# Patient Record
Sex: Female | Born: 2000 | Race: Black or African American | Hispanic: No | Marital: Single | Smoking: Never smoker
Health system: Southern US, Community
[De-identification: ages and names within clinical notes are randomized; demographics above are authoritative.]

## PROBLEM LIST (undated history)

## (undated) DIAGNOSIS — N760 Acute vaginitis: Secondary | ICD-10-CM

## (undated) DIAGNOSIS — B9689 Other specified bacterial agents as the cause of diseases classified elsewhere: Secondary | ICD-10-CM

## (undated) DIAGNOSIS — Z8619 Personal history of other infectious and parasitic diseases: Secondary | ICD-10-CM

## (undated) HISTORY — DX: Personal history of other infectious and parasitic diseases: Z86.19

## (undated) HISTORY — DX: Acute vaginitis: B96.89

## (undated) HISTORY — PX: TONSILLECTOMY: SUR1361

## (undated) HISTORY — DX: Other specified bacterial agents as the cause of diseases classified elsewhere: B96.89

## (undated) HISTORY — DX: Other specified bacterial agents as the cause of diseases classified elsewhere: N76.0

---
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
## 2019-02-08 ENCOUNTER — Telehealth: Payer: Self-pay | Admitting: Pediatrics

## 2019-02-08 NOTE — Telephone Encounter (Signed)
Patient is calling and requesting to speak with RN Keri. She was offered a virtual appointment with Alyse Low and the patient declined. Patient was then informed that Kristin Bruins will call them back at their primary phone number at [phone]

## 2019-02-08 NOTE — Telephone Encounter (Signed)
Spoke with patient. She had exposure to STI with partner. No current symptoms. Partner tested positive today. Made soonest avail onsite visit for treatment and additional screenings. Pt to abstain from sex from now until 7 days after treatment. Pt also voiced concerns about mother being made aware of positive STI results from her OBGYN (name of office unknown). Explained that [facility] does not disclose confidential information unless permission has been given by patient and we take such matters very seriously. [facility] will continue to disclose information only to patient as this is our protocol. Suggested patient call her OBGYN office and update all information to her contact information and make office aware of breach of confidentiality.

## 2019-02-08 NOTE — Telephone Encounter (Signed)
Called number on file X2, went straight to VM, voicemail box full and cannot accept messages.

## 2019-02-09 ENCOUNTER — Telehealth: Payer: Self-pay | Admitting: Pediatrics

## 2019-02-09 NOTE — Telephone Encounter (Signed)

## 2019-02-10 ENCOUNTER — Ambulatory Visit: Payer: 59 | Admitting: Family

## 2019-02-16 ENCOUNTER — Telehealth: Payer: Self-pay | Admitting: Pediatrics

## 2019-02-16 NOTE — Telephone Encounter (Signed)

## 2019-02-17 ENCOUNTER — Ambulatory Visit (INDEPENDENT_AMBULATORY_CARE_PROVIDER_SITE_OTHER): Payer: 59 | Admitting: Family

## 2019-02-17 ENCOUNTER — Encounter: Payer: Self-pay | Admitting: Family

## 2019-02-17 ENCOUNTER — Other Ambulatory Visit (HOSPITAL_COMMUNITY)
Admission: RE | Admit: 2019-02-17 | Discharge: 2019-02-17 | Disposition: A | Discharge status: Home / Self Care | Payer: 59 | Source: Ambulatory Visit | Attending: Family | Admitting: Family

## 2019-02-17 ENCOUNTER — Other Ambulatory Visit: Payer: Self-pay

## 2019-02-17 DIAGNOSIS — Z202 Contact with and (suspected) exposure to infections with a predominantly sexual mode of transmission: Secondary | ICD-10-CM | POA: Insufficient documentation

## 2019-02-17 MED ORDER — ONDANSETRON 4 MG PO TBDP: 8.0000 mg | ORAL_TABLET | Freq: Once | ORAL | Status: DC

## 2019-02-17 MED ORDER — METRONIDAZOLE 250 MG PO TABS: 2000.0000 mg | ORAL_TABLET | Freq: Once | ORAL | Status: DC

## 2019-02-17 MED ORDER — ONDANSETRON 8 MG PO TBDP: 8.0000 mg | ORAL_TABLET | Freq: Three times a day (TID) | ORAL | 0 refills | Status: DC | PRN

## 2019-02-17 MED ORDER — METRONIDAZOLE 500 MG PO TABS: 500.0000 mg | ORAL_TABLET | Freq: Two times a day (BID) | ORAL | 0 refills | Status: DC

## 2019-02-17 NOTE — Progress Notes (Signed)
Pt here today for presumptive tx of Trichomoniasis. Partner tested positive last week- tested negative for Gonorrhea and Chlamydia. Pt denies pelvic pain and pain with intercourse. RX sent to pharmacy and she will take today after eating well.

## 2019-02-18 ENCOUNTER — Encounter: Payer: Self-pay | Admitting: Family

## 2019-02-21 LAB — URINE CYTOLOGY ANCILLARY ONLY
Bacterial Vaginitis-Urine: POSITIVE — AB
Bacterial Vaginitis-Urine: POSITIVE — AB
Bacterial Vaginitis-Urine: POSITIVE — AB
Bacterial Vaginitis-Urine: POSITIVE — AB
Candida Urine: NEGATIVE
Chlamydia: NEGATIVE
Comment: NEGATIVE
Comment: NEGATIVE
Comment: NORMAL
Neisseria Gonorrhea: NEGATIVE
Trichomonas: NEGATIVE

## 2019-07-21 ENCOUNTER — Other Ambulatory Visit (HOSPITAL_COMMUNITY)
Admission: RE | Admit: 2019-07-21 | Discharge: 2019-07-21 | Disposition: A | Discharge status: Home / Self Care | Payer: 59 | Source: Ambulatory Visit | Attending: Family | Admitting: Family

## 2019-07-21 ENCOUNTER — Encounter: Payer: Self-pay | Admitting: Family

## 2019-07-21 ENCOUNTER — Other Ambulatory Visit: Payer: Self-pay

## 2019-07-21 ENCOUNTER — Ambulatory Visit (INDEPENDENT_AMBULATORY_CARE_PROVIDER_SITE_OTHER): Payer: 59 | Admitting: Family

## 2019-07-21 VITALS — BP 112/69 | HR 105 | Ht 64.33 in | Wt 105.8 lb

## 2019-07-21 DIAGNOSIS — Z30011 Encounter for initial prescription of contraceptive pills: Secondary | ICD-10-CM

## 2019-07-21 DIAGNOSIS — N898 Other specified noninflammatory disorders of vagina: Secondary | ICD-10-CM

## 2019-07-21 DIAGNOSIS — Z113 Encounter for screening for infections with a predominantly sexual mode of transmission: Secondary | ICD-10-CM | POA: Diagnosis not present

## 2019-07-21 MED ORDER — NORETHINDRONE ACET-ETHINYL EST 1.5-30 MG-MCG PO TABS: 1.0000 | ORAL_TABLET | Freq: Every day | ORAL | 3 refills | Status: DC

## 2019-07-21 NOTE — Progress Notes (Signed)
History was provided by the patient.   Beth Warren is a 19 y.o. female who is here for STI screening and change in birth control.   PCP confirmed? Yes.    Ander Slade, NP  HPI:   -has been on patch and wants to switch  -states patches move around  -when she was on Depo prior she had breakthrough bleeding  -does not want an implant or IUD  -thinks she would like to try the pill  -states that when she was treated for presumptive Trich in November, which was actually BV -she reports being unable swallow pills so she would like STI testing; having green vaginal discharge  -she smokes black&milds occasionally, asks if cigars have nicotine; is willing to stop smoking in order to be on OCPs.      Review of Systems  Constitutional: Negative for chills, fever and malaise/fatigue.  HENT: Negative for sore throat.   Eyes: Negative for blurred vision and pain.  Respiratory: Negative for cough and shortness of breath.   Cardiovascular: Negative for chest pain and palpitations.  Gastrointestinal: Negative for abdominal pain and nausea.  Genitourinary: Negative for dysuria and hematuria.       No dyspareunia, no cramping    Musculoskeletal: Negative for joint pain and myalgias.  Skin: Negative for rash.  Neurological: Negative for dizziness and headaches.  Endo/Heme/Allergies: Does not bruise/bleed easily.  Psychiatric/Behavioral: Negative for depression and suicidal ideas.    Patient Active Problem List   Diagnosis Date Noted  . Dyspepsia 05/27/2018  . Teenage mother 07/16/2015    Current Outpatient Medications on File Prior to Visit  Medication Sig Dispense Refill  . adapalene (DIFFERIN) 0.1 % gel Apply topically at bedtime. (Patient not taking: Reported on 10/11/2018) 45 g 0  . clindamycin-benzoyl peroxide (BENZACLIN) gel Apply topically daily. (Patient not taking: Reported on 10/11/2018) 50 g 4  . metroNIDAZOLE (FLAGYL) 500 MG tablet Take 1 tablet (500 mg total) by mouth 2  (two) times daily. (Patient not taking: Reported on 07/21/2019) 14 tablet 0  . norelgestromin-ethinyl estradiol (ORTHO EVRA) 150-35 MCG/24HR transdermal patch Place 1 patch onto the skin once a week. (Patient not taking: Reported on 07/21/2019) 3 patch 12  . ondansetron (ZOFRAN ODT) 8 MG disintegrating tablet Take 1 tablet (8 mg total) by mouth every 8 (eight) hours as needed for nausea or vomiting. (Patient not taking: Reported on 07/21/2019) 20 tablet 0  . ranitidine (ZANTAC) 150 MG tablet Take 1 tablet (150 mg total) by mouth 2 (two) times daily. (Patient not taking: Reported on 09/04/2018) 60 tablet 1  . triamcinolone ointment (KENALOG) 0.1 % Apply 1 application topically 2 (two) times daily. (Patient not taking: Reported on 07/21/2019) 30 g 3   No current facility-administered medications on file prior to visit.    Allergies  Allergen Reactions  . Peach [Prunus Persica] Itching    6/20.     Physical Exam:    Vitals:   07/21/19 1021  BP: 112/69  Pulse: (!) 105  Weight: 105 lb 12.8 oz (48 kg)  Height: 5' 4.33" (1.634 m)    Growth percentile SmartLinks can only be used for patients less than 38 years old. No LMP recorded.  Physical Exam Vitals reviewed.  Constitutional:      Appearance: Normal appearance.  HENT:     Head: Normocephalic.     Nose: Nose normal.     Mouth/Throat:     Mouth: Mucous membranes are dry.  Eyes:     Extraocular Movements:  Extraocular movements intact.     Pupils: Pupils are equal, round, and reactive to light.  Cardiovascular:     Rate and Rhythm: Normal rate.     Pulses: Normal pulses.     Heart sounds: No murmur.  Pulmonary:     Effort: Pulmonary effort is normal.     Breath sounds: Normal breath sounds.  Abdominal:     General: Abdomen is flat.  Musculoskeletal:        General: Normal range of motion.     Cervical back: Normal range of motion.  Lymphadenopathy:     Cervical: No cervical adenopathy.  Skin:    General: Skin is warm and dry.      Capillary Refill: Capillary refill takes less than 2 seconds.     Findings: No rash.  Neurological:     General: No focal deficit present.     Mental Status: She is alert and oriented to person, place, and time.  Psychiatric:        Mood and Affect: Mood normal.    Assessment/Plan:  19 yo A/I female currently on contraceptive patch; she desires to change to OCPs. She also is concerned about vaginal discharge which she describes as green. She has no concerning symptoms which would warrant a pelvic exam at this time (no unexplained vaginal bleeding and no pain with intercourse). Today we will do a wet prep and urine screening to test for infections. Discussed contraceptive options and she elects COCs without any contraindications for estrogen. We reviewed increased stroke risk and clotting risks with nicotine use.    1. Vaginal discharge 2. Encounter for BCP (birth control pills) initial prescription 3. Routine screening for STI (sexually transmitted infection)  - WET PREP BY MOLECULAR PROBE - Urine cytology ancillary only

## 2019-07-22 ENCOUNTER — Telehealth: Payer: Self-pay

## 2019-07-22 LAB — URINE CYTOLOGY ANCILLARY ONLY
Chlamydia: NEGATIVE
Comment: NEGATIVE
Comment: NORMAL
Neisseria Gonorrhea: POSITIVE — AB

## 2019-07-22 LAB — WET PREP BY MOLECULAR PROBE
Candida species: NOT DETECTED
MICRO NUMBER:: 10341964
SPECIMEN QUALITY:: ADEQUATE
Trichomonas vaginosis: NOT DETECTED

## 2019-07-22 NOTE — Telephone Encounter (Signed)
Please call pt back with results. She does not understands the results that were sent on mychart.

## 2019-07-23 ENCOUNTER — Encounter: Payer: Self-pay | Admitting: Family

## 2019-07-25 NOTE — Telephone Encounter (Signed)
Called number on file, no answer, VM box full.   

## 2019-07-26 ENCOUNTER — Telehealth: Payer: Self-pay | Admitting: Pediatrics

## 2019-07-26 ENCOUNTER — Other Ambulatory Visit: Payer: Self-pay | Admitting: Pediatrics

## 2019-07-26 DIAGNOSIS — Z113 Encounter for screening for infections with a predominantly sexual mode of transmission: Secondary | ICD-10-CM

## 2019-07-26 DIAGNOSIS — A549 Gonococcal infection, unspecified: Secondary | ICD-10-CM

## 2019-07-26 MED ORDER — CEFTRIAXONE SODIUM 500 MG IJ SOLR
500.0000 mg | Freq: Once | INTRAMUSCULAR | Status: AC
  Administered 2019-08-18: 16:00:00 500 mg via INTRAMUSCULAR

## 2019-07-26 NOTE — Telephone Encounter (Signed)
Patient called and stated that she wa told to see when she can come in for appt. I was going to make and appt and she she said nevermind she will be here tomorrow at 330. Please call back.

## 2019-07-26 NOTE — Telephone Encounter (Signed)
No one from the clinic has spoken with patient. RN has called multiple times with no success. If patient walks into clinic, will ask available CMA to give Rocephin. Tried calling patient again to make appointment with RN. No answer and no VM option avail.

## 2019-07-26 NOTE — Telephone Encounter (Signed)
Order for rocephin placed. Also placed lab order for HIV and syphilis.

## 2019-07-27 ENCOUNTER — Ambulatory Visit (INDEPENDENT_AMBULATORY_CARE_PROVIDER_SITE_OTHER): Payer: 59

## 2019-07-27 ENCOUNTER — Other Ambulatory Visit: Payer: Self-pay | Admitting: Pediatrics

## 2019-07-27 DIAGNOSIS — A599 Trichomoniasis, unspecified: Secondary | ICD-10-CM

## 2019-07-27 DIAGNOSIS — Z202 Contact with and (suspected) exposure to infections with a predominantly sexual mode of transmission: Secondary | ICD-10-CM

## 2019-07-27 MED ORDER — METRONIDAZOLE 500 MG PO TABS: 500.0000 mg | ORAL_TABLET | Freq: Two times a day (BID) | ORAL | 0 refills | Status: AC

## 2019-07-27 MED ORDER — ONDANSETRON HCL 8 MG PO TABS: 8.0000 mg | ORAL_TABLET | Freq: Three times a day (TID) | ORAL | 0 refills | Status: DC | PRN

## 2019-07-27 NOTE — Progress Notes (Signed)
Pt states she has been sexually active with a previous partner with trich. He was treated, but she has been unable to keep medication down. Made her aware that she tested negative on 4/8 for trich. Vaginal symptoms are "bothering her" and she worries it is unresolved trich. Likely Gonorrhea. Pt has appointment tomorrow for treatment and blood work for HIV and RPR.

## 2019-07-27 NOTE — Telephone Encounter (Signed)
Done and zofran sent with it if she needs it. Still needs to come in for tx for gonorrhea.

## 2019-07-27 NOTE — Telephone Encounter (Signed)
No... trich is flagyl. We can give her zofran and food with it. We will treat presumptively with 2 grams in clinic + 8 mg zofran OR I can send 500 mg BID x 7 days. Let me know which she would prefer.

## 2019-07-27 NOTE — Telephone Encounter (Signed)
She will also do HIV and RPR as well.

## 2019-07-27 NOTE — Telephone Encounter (Signed)
Pt states she has been sexually active with a previous partner with trich. He was treated, but she has been unable to keep medication down. Made her aware that she tested negative on 4/8 for trich. Vaginal symptoms are "bothering her" and she worries it is unresolved trich. Likely Gonorrhea. Pt has appointment tomorrow for treatment.

## 2019-07-28 ENCOUNTER — Other Ambulatory Visit: Payer: 59

## 2019-08-01 ENCOUNTER — Other Ambulatory Visit: Payer: 59

## 2019-08-18 ENCOUNTER — Other Ambulatory Visit: Payer: Self-pay

## 2019-08-18 ENCOUNTER — Other Ambulatory Visit (INDEPENDENT_AMBULATORY_CARE_PROVIDER_SITE_OTHER): Payer: 59

## 2019-08-18 DIAGNOSIS — A549 Gonococcal infection, unspecified: Secondary | ICD-10-CM

## 2019-08-18 DIAGNOSIS — Z113 Encounter for screening for infections with a predominantly sexual mode of transmission: Secondary | ICD-10-CM | POA: Diagnosis not present

## 2019-08-18 NOTE — Progress Notes (Signed)
Deleted pre charted note after patient no showed for appt

## 2019-08-18 NOTE — Progress Notes (Signed)
Patient came in for labs RPR and HIV antibody (with reflex). Labs ordered by Alfonso Ramus. Successful collection.

## 2019-08-19 LAB — RPR: RPR Ser Ql: NONREACTIVE

## 2019-08-19 LAB — HIV ANTIBODY (ROUTINE TESTING W REFLEX): HIV 1&2 Ab, 4th Generation: NONREACTIVE

## 2019-08-29 ENCOUNTER — Encounter: Payer: Self-pay | Admitting: Pediatrics

## 2019-09-22 ENCOUNTER — Ambulatory Visit: Payer: 59 | Admitting: Family

## 2019-10-06 ENCOUNTER — Ambulatory Visit: Payer: 59 | Admitting: Family

## 2019-12-22 ENCOUNTER — Encounter: Payer: Self-pay | Admitting: Internal Medicine

## 2019-12-22 ENCOUNTER — Ambulatory Visit (INDEPENDENT_AMBULATORY_CARE_PROVIDER_SITE_OTHER): Payer: 59 | Admitting: Internal Medicine

## 2019-12-22 ENCOUNTER — Encounter: Payer: Self-pay | Admitting: Nurse Practitioner

## 2019-12-22 ENCOUNTER — Other Ambulatory Visit: Payer: Self-pay

## 2019-12-22 VITALS — BP 112/66 | HR 70 | Temp 98.0°F | Ht 64.3 in | Wt 110.0 lb

## 2019-12-22 DIAGNOSIS — Z1159 Encounter for screening for other viral diseases: Secondary | ICD-10-CM

## 2019-12-22 DIAGNOSIS — Z113 Encounter for screening for infections with a predominantly sexual mode of transmission: Secondary | ICD-10-CM

## 2019-12-22 DIAGNOSIS — R0789 Other chest pain: Secondary | ICD-10-CM | POA: Diagnosis not present

## 2019-12-22 DIAGNOSIS — R7401 Elevation of levels of liver transaminase levels: Secondary | ICD-10-CM | POA: Diagnosis not present

## 2019-12-22 DIAGNOSIS — Z308 Encounter for other contraceptive management: Secondary | ICD-10-CM

## 2019-12-22 NOTE — Patient Instructions (Signed)

## 2019-12-22 NOTE — Progress Notes (Signed)
I,[name],acting as a Education administrator for Maximino Greenland, MD.,have documented all relevant documentation on the behalf of Maximino Greenland, MD,as directed by  Maximino Greenland, MD while in the presence of Maximino Greenland, MD.  This visit occurred during the SARS-CoV-2 public health emergency.  Safety protocols were in place, including screening questions prior to the visit, additional usage of staff PPE, and extensive cleaning of exam room while observing appropriate contact time as indicated for disinfecting solutions.  Subjective:     Patient ID: Beth Warren , female    DOB: 01-04-2001 , 19 y.o.   MRN: [id number]   Chief Complaint  Patient presents with  . Establish Care  . Chest Pain  . Contraception    HPI  The patient is here today to establish care.  She is a 19 year old female, employed at [employer] -- referred by her Mom, KB who is also a patient of [facility].  She denies past medical history of high blood pressure, diabetes and sickle cells anemia.  Today, she is most concerned about chest pains. The patient is concerned about chest pain that she said she been having since high school. She reports having intermittent episodes of chest pain. She denies associated shortness of breath and palpitations.  She is not sure what is contributing to her symptoms. She is not able to identify what triggers her sx. She reports pains seem to come out from nowhere - occur both at rest and exertion.    Pt not a good historian, states room too cold.   Chest Pain  This is a recurrent problem. The current episode started more than 1 year ago. The onset quality is sudden. The problem occurs intermittently. The problem has been unchanged. The pain is at a severity of 10/10. The pain is severe. The quality of the pain is described as sharp. Pertinent negatives include no back pain, claudication, cough, diaphoresis, dizziness, leg pain, lower extremity edema, orthopnea, palpitations or shortness of breath. Risk  factors include lack of exercise.     History reviewed. No pertinent past medical history.   Family History  Problem Relation Age of Onset  . GER disease Mother   . Asthma Brother      Current Outpatient Medications:  .  Norethindrone Acetate-Ethinyl Estradiol (JUNEL 1.5/30) 1.5-30 MG-MCG tablet, Take 1 tablet by mouth daily. (Patient not taking: Reported on 12/22/2019), Disp: 84 tablet, Rfl: 3 .  tinidazole (TINDAMAX) 500 MG tablet, Take one tablet by mouth for 4 days., Disp: 4 tablet, Rfl: 0   Allergies  Allergen Reactions  . Peach [Prunus Persica] Itching    6/20.       The patient states she uses Ortho-Evra patches weekly for birth control. Last LMP was Patient's last menstrual period was 12/15/2019 (approximate).. Negative for Dysmenorrhea. Negative for: breast discharge, breast lump(s), breast pain and breast self exam. Associated symptoms include abnormal vaginal bleeding. Pertinent negatives include abnormal bleeding (hematology), anxiety, decreased libido, depression, difficulty falling sleep, dyspareunia, history of infertility, nocturia, sexual dysfunction, sleep disturbances, urinary incontinence, urinary urgency, vaginal discharge and vaginal itching. Diet regular.The patient states her exercise level is    . The patient's tobacco use is:  Social History   Tobacco Use  Smoking Status Never Smoker  Smokeless Tobacco Never Used  . She has been exposed to passive smoke. The patient's alcohol use is:  Social History   Substance and Sexual Activity  Alcohol Use None     Review of Systems  Constitutional:  Negative.  Negative for diaphoresis.  Respiratory: Negative.  Negative for cough and shortness of breath.   Cardiovascular: Positive for chest pain (since high school). Negative for palpitations, orthopnea and claudication.  Gastrointestinal: Negative.   Genitourinary:       Wants to switch to a new birth control. She is currently on Ortho Evra patch - wants to switch  to pills. She had her last cycle a week ago. Then states she is not currently on birth control. She is currently sexually active with men. G1P1, son 5 years ago, vaginal delivery.   Musculoskeletal: Negative for back pain.  Neurological: Negative for dizziness.  Psychiatric/Behavioral: Negative.   All other systems reviewed and are negative.    Today's Vitals   12/22/19 1517  BP: 112/66  Pulse: 70  Temp: 98 F (36.7 C)  TempSrc: Oral  Weight: 110 lb (49.9 kg)  Height: 5' 4.3" (1.633 m)   Body mass index is 18.71 kg/m.   Objective:  Physical Exam Vitals and nursing note reviewed.  Constitutional:      Appearance: Normal appearance. She is well-developed.  HENT:     Head: Normocephalic and atraumatic.  Cardiovascular:     Rate and Rhythm: Normal rate and regular rhythm.     Heart sounds: Normal heart sounds.  Pulmonary:     Breath sounds: Normal breath sounds.  Chest:     Chest wall: No tenderness.     Comments: tattoos Skin:    General: Skin is warm.  Neurological:     General: No focal deficit present.     Mental Status: She is alert and oriented to person, place, and time.         Assessment And Plan:     1. Atypical chest pain Comments: She declined EKG. I will refer her to Cardiology for further evaluation as per her request. Mother reports she has history of bicuspid valve and she was told this could cause chest pain.  Pt advised that her workup may include echocardiogram as well. I will also check labs as listed below.  - CBC no Diff - CMP14+EGFR - Ambulatory referral to Cardiology  2. Encounter for HCV screening test for low risk patient Comments: I will check HCV antibody. - Hepatitis C antibody  3. Screen for STD (sexually transmitted disease) Comments: I will check for gonorrhea,chlamydia as requested. She declined HIV and syphilis testing.  - Chlamydia/Gonococcus/Trichomonas, NAA  4. Encounter for other contraceptive management Comments: Advised  that I will initiate therapy once she has had Cardiology evaluation.      Patient was given opportunity to ask questions. Patient verbalized understanding of the plan and was able to repeat key elements of the plan. All questions were answered to their satisfaction.   Maximino Greenland, MD   I, Maximino Greenland, MD, have reviewed all documentation for this visit. The documentation on 12/28/19 for the exam, diagnosis, procedures, and orders are all accurate and complete.  THE PATIENT IS ENCOURAGED TO PRACTICE SOCIAL DISTANCING DUE TO THE COVID-19 PANDEMIC.

## 2019-12-23 LAB — CBC
Hematocrit: 36.4 % (ref 34.0–46.6)
Hemoglobin: 12.2 g/dL (ref 11.1–15.9)
MCH: 34.4 pg — ABNORMAL HIGH (ref 26.6–33.0)
MCHC: 33.5 g/dL (ref 31.5–35.7)
MCV: 103 fL — ABNORMAL HIGH (ref 79–97)
Platelets: 174 10*3/uL (ref 150–450)
RBC: 3.55 x10E6/uL — ABNORMAL LOW (ref 3.77–5.28)
RDW: 10.7 % — ABNORMAL LOW (ref 11.7–15.4)
WBC: 5.8 10*3/uL (ref 3.4–10.8)

## 2019-12-23 LAB — CMP14+EGFR
ALT: 7 IU/L (ref 0–32)
AST: 12 IU/L (ref 0–40)
Albumin/Globulin Ratio: 2.2 (ref 1.2–2.2)
Albumin: 4.4 g/dL (ref 3.9–5.0)
Alkaline Phosphatase: 45 IU/L (ref 45–106)
BUN/Creatinine Ratio: 10 (ref 9–23)
BUN: 7 mg/dL (ref 6–20)
Bilirubin Total: 0.3 mg/dL (ref 0.0–1.2)
CO2: 26 mmol/L (ref 20–29)
Calcium: 9.1 mg/dL (ref 8.7–10.2)
Chloride: 102 mmol/L (ref 96–106)
Creatinine, Ser: 0.69 mg/dL (ref 0.57–1.00)
GFR calc Af Amer: 145 mL/min/{1.73_m2} (ref >59)
GFR calc non Af Amer: 126 mL/min/{1.73_m2} (ref >59)
Globulin, Total: 2 g/dL (ref 1.5–4.5)
Glucose: 57 mg/dL — ABNORMAL LOW (ref 65–99)
Potassium: 3.8 mmol/L (ref 3.5–5.2)
Sodium: 141 mmol/L (ref 134–144)
Total Protein: 6.4 g/dL (ref 6.0–8.5)

## 2019-12-23 LAB — HEPATITIS C ANTIBODY: Hep C Virus Ab: <0.1 s/co ratio (ref 0.0–0.9)

## 2019-12-26 LAB — CHLAMYDIA/GONOCOCCUS/TRICHOMONAS, NAA
Chlamydia by NAA: NEGATIVE
Gonococcus by NAA: NEGATIVE
Trich vag by NAA: POSITIVE — AB

## 2019-12-27 LAB — B12 AND FOLATE PANEL
Folate: 8.4 ng/mL (ref >3.0)
Vitamin B-12: 259 pg/mL (ref 232–1245)

## 2019-12-27 LAB — SPECIMEN STATUS REPORT

## 2019-12-28 ENCOUNTER — Other Ambulatory Visit: Payer: Self-pay

## 2019-12-28 ENCOUNTER — Telehealth: Payer: Self-pay

## 2019-12-28 ENCOUNTER — Encounter: Payer: Self-pay | Admitting: Internal Medicine

## 2019-12-28 MED ORDER — TINIDAZOLE 500 MG PO TABS: ORAL_TABLET | ORAL | 0 refills | Status: DC

## 2019-12-28 NOTE — Telephone Encounter (Signed)
-----   Message from Dorothyann Peng, MD sent at 12/26/2019  7:31 PM EDT ----- Urine is positive for Trichomonas. Partner should be treated. Please send rx for Tindamax 500mg  - 4 tabs po x 1. #4/zero refills. Should not drink alcohol for one week while on this medication.

## 2019-12-28 NOTE — Telephone Encounter (Signed)
Unable to leave vm. rx sent.

## 2019-12-29 ENCOUNTER — Other Ambulatory Visit: Payer: Self-pay

## 2019-12-29 MED ORDER — TINIDAZOLE 500 MG PO TABS: ORAL_TABLET | ORAL | 0 refills | Status: DC

## 2019-12-30 ENCOUNTER — Telehealth: Payer: Self-pay

## 2019-12-30 NOTE — Telephone Encounter (Signed)
-----   Message from Dorothyann Peng, MD sent at 12/29/2019 10:16 PM EDT ----- B12 level is low - this can lead to irreversible memory issues. Needs to start once weekly B12 injections x 4, then once monthly.

## 2019-12-30 NOTE — Telephone Encounter (Signed)
Left the patient a message to call back for lab results. 

## 2019-12-30 NOTE — Telephone Encounter (Signed)
Prior auth completed for tinidazole 500 mg, waiting on a response from the pt's insurance company.

## 2020-01-02 ENCOUNTER — Telehealth: Payer: Self-pay

## 2020-01-02 NOTE — Telephone Encounter (Signed)
The pt called to check on her cardiology referral, the pt was given her cardiology information, her most recent lab results and nurse visit appt scheduled for her b12 injection.

## 2020-01-04 LAB — METHYLMALONIC ACID, SERUM

## 2020-01-04 LAB — SPECIMEN STATUS REPORT

## 2020-01-10 ENCOUNTER — Ambulatory Visit: Payer: 59

## 2020-01-25 ENCOUNTER — Ambulatory Visit: Payer: 59 | Admitting: Internal Medicine

## 2020-02-02 ENCOUNTER — Other Ambulatory Visit: Payer: Self-pay

## 2020-02-02 ENCOUNTER — Ambulatory Visit (INDEPENDENT_AMBULATORY_CARE_PROVIDER_SITE_OTHER): Payer: 59 | Admitting: Internal Medicine

## 2020-02-02 ENCOUNTER — Encounter: Payer: Self-pay | Admitting: Internal Medicine

## 2020-02-02 VITALS — BP 104/68 | HR 83 | Ht 64.0 in | Wt 111.8 lb

## 2020-02-02 DIAGNOSIS — R079 Chest pain, unspecified: Secondary | ICD-10-CM

## 2020-02-02 NOTE — Patient Instructions (Addendum)
Medication Instructions:  No Changes In Medications at this time.  *If you need a refill on your cardiac medications before your next appointment, please call your pharmacy*  Lab Work: None Ordered At This Time.  If you have labs (blood work) drawn today and your tests are completely normal, you will receive your results only by: Marland Kitchen MyChart Message (if you have MyChart) OR . A paper copy in the mail If you have any lab test that is abnormal or we need to change your treatment, we will call you to review the results.  Testing/Procedures: Your physician has requested that you have an echocardiogram. Echocardiography is a painless test that uses sound waves to create images of your heart. It provides your doctor with information about the size and shape of your heart and how well your heart's chambers and valves are working. You may receive an ultrasound enhancing agent through an IV if needed to better visualize your heart during the echo.This procedure takes approximately one hour. There are no restrictions for this procedure.   Follow-Up: At [facility], you and your health needs are our priority.  As part of our continuing mission to provide you with exceptional heart care, we have created designated Provider Care Teams.  These Care Teams include your primary Cardiologist (physician) and Advanced Practice Providers (APPs -  Physician Assistants and Nurse Practitioners) who all work together to provide you with the care you need, when you need it.  Your next appointment:   FOLLOW UP AS NEEDED   The format for your next appointment:   In Person  Provider:   Weston Brass, MD

## 2020-02-02 NOTE — Progress Notes (Signed)
Cardiology Office Note:    Date:  02/02/2020   ID:  Beth Warren, DOB [birth year], MRN [id number]  PCP:  Beth Peng, MD  Cardiologist:  No primary care provider on file.  Electrophysiologist:  None   Referring MD: Beth Peng, MD   Chief Complaint/Reason for Referral: Chest pain  History of Present Illness:    Beth Warren is a 19 y.o. female with no significant past medical history and family history of bicuspid aortic valve in her mother who presents for evaluation of chest pain.  She has previously had a normal EKG during episodes of chest pain.  Last week she had a hard episode of chest pain.  She notes that she has had it several times a year in the past but has increasing in frequency to weekly to monthly symptoms.  It will last for seconds to minutes at a time and primarily occurs when she is talking or can occur when she is laying in bed.  She will have to take a slow deep breath to try to improve her symptoms.  No definite aggravating or alleviating factors.  Not clearly associated with food.  She has a smoking history.  Family history is significant for her mother having a bicuspid aortic valve.  Her mother Beth Warren is my patient as well.  We discussed indications for screening for bicuspid aortic valve.  She denies significant shortness of breath, palpitations, PND, orthopnea, leg swelling.  Denies syncope or presyncope, denies cough, fever, chills, nausea, vomiting, diarrhea.  No past medical history on file.  Past Surgical History:  Procedure Laterality Date  . TONSILLECTOMY    . WISDOM TOOTH EXTRACTION  2018    Current Medications: No outpatient medications have been marked as taking for the 02/02/20 encounter (Office Visit) with Beth Poisson, MD.     Allergies:   Patient has no active allergies.   Social History   Tobacco Use  . Smoking status: Never Smoker  . Smokeless tobacco: Never Used  Substance Use Topics  . Alcohol use: Not on file  .  Drug use: Not on file     Family History: The patient's family history includes Asthma in her brother; GER disease in her mother.  ROS:   Please see the history of present illness.    All other systems reviewed and are negative.  EKGs/Labs/Other Studies Reviewed:    The following studies were reviewed today:  EKG:  NSR   Recent Labs: 12/22/2019: ALT 7; BUN 7; Creatinine, Ser 0.69; Hemoglobin 12.2; Platelets 174; Potassium 3.8; Sodium 141  Recent Lipid Panel No results found for: CHOL, TRIG, HDL, CHOLHDL, VLDL, LDLCALC, LDLDIRECT  Physical Exam:    VS:  BP 104/68   Pulse 83   Ht 5\' 4"  (1.626 m)   Wt 111 lb 12.8 oz (50.7 kg)   BMI 19.19 kg/m     Wt Readings from Last 5 Encounters:  02/02/20 111 lb 12.8 oz (50.7 kg)  12/22/19 110 lb (49.9 kg)  07/21/19 105 lb 12.8 oz (48 kg)  08/24/18 110 lb (49.9 kg) (16 %, Z= -0.99)*  06/18/18 108 lb 9.6 oz (49.3 kg) (14 %, Z= -1.07)*   * Growth percentiles are based on CDC (Girls, 2-20 Years) data.    Constitutional: No acute distress Eyes: sclera non-icteric, normal conjunctiva and lids ENMT: normal dentition, moist mucous membranes Cardiovascular: regular rhythm, normal rate, no murmurs. S1 and S2 normal. Radial pulses normal bilaterally. No jugular venous distention.  Respiratory: clear to auscultation bilaterally  GI : normal bowel sounds, soft and nontender. No distention.   MSK: extremities warm, well perfused. No edema.  NEURO: grossly nonfocal exam, moves all extremities. PSYCH: alert and oriented x 3, normal mood and affect.   ASSESSMENT:    1. Chest pain of uncertain etiology    PLAN:    Chest pain of uncertain etiology - Plan: EKG 12-Lead, ECHOCARDIOGRAM COMPLETE   I would like to start with an echocardiogram to screen for anomalous coronary arteries, or any possible congenital heart disease.  She has a family history of bicuspid aortic valve and we will need to screen for this as well.  Her pain sounds atypical for  ischemia.  Beth Brass, MD [place]  [facility]    Medication Adjustments/Labs and Tests Ordered: Current medicines are reviewed at length with the patient today.  Concerns regarding medicines are outlined above.   Orders Placed This Encounter  Procedures  . EKG 12-Lead  . ECHOCARDIOGRAM COMPLETE    No orders of the defined types were placed in this encounter.   Patient Instructions  Medication Instructions:  No Changes In Medications at this time.  *If you need a refill on your cardiac medications before your next appointment, please call your pharmacy*  Lab Work: None Ordered At This Time.  If you have labs (blood work) drawn today and your tests are completely normal, you will receive your results only by: Marland Kitchen MyChart Message (if you have MyChart) OR . A paper copy in the mail If you have any lab test that is abnormal or we need to change your treatment, we will call you to review the results.  Testing/Procedures: None Ordered At This Time.   Follow-Up: At [facility], you and your health needs are our priority.  As part of our continuing mission to provide you with exceptional heart care, we have created designated Provider Care Teams.  These Care Teams include your primary Cardiologist (physician) and Advanced Practice Providers (APPs -  Physician Assistants and Nurse Practitioners) who all work together to provide you with the care you need, when you need it.  Your next appointment:   FOLLOW UP AS NEEDED   The format for your next appointment:   In Person  Provider:   Weston Brass, MD

## 2020-02-16 ENCOUNTER — Telehealth: Payer: Self-pay

## 2020-02-16 NOTE — Telephone Encounter (Signed)
I returned the pt's call to schedule her an appt to be retested, the pt's voicemail is full.

## 2020-03-02 ENCOUNTER — Ambulatory Visit (INDEPENDENT_AMBULATORY_CARE_PROVIDER_SITE_OTHER): Payer: 59

## 2020-03-02 ENCOUNTER — Other Ambulatory Visit: Payer: Self-pay

## 2020-03-02 DIAGNOSIS — R079 Chest pain, unspecified: Secondary | ICD-10-CM | POA: Diagnosis not present

## 2020-03-02 LAB — ECHOCARDIOGRAM COMPLETE
AR max vel: 2.32 cm2
AV Area VTI: 2.63 cm2
AV Area mean vel: 2.66 cm2
AV Mean grad: 3 mmHg
AV Peak grad: 6 mmHg
Ao pk vel: 1.22 m/s
Area-P 1/2: 4.39 cm2
S' Lateral: 2.3 cm

## 2020-03-12 ENCOUNTER — Ambulatory Visit: Payer: 59 | Admitting: Nurse Practitioner

## 2020-03-13 ENCOUNTER — Telehealth: Payer: Self-pay

## 2020-03-13 DIAGNOSIS — I428 Other cardiomyopathies: Secondary | ICD-10-CM

## 2020-03-13 DIAGNOSIS — Z8279 Family history of other congenital malformations, deformations and chromosomal abnormalities: Secondary | ICD-10-CM

## 2020-03-13 NOTE — Telephone Encounter (Addendum)
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 I'd like to get a better look at this, and get a better look at the aortic valve. I looked at the echo and I can't be sure that her aortic valve is normal. Since bicuspid aortic valve runs in her family we need to check.   Please order a cardiac MRI for bicuspid aortic valve and LV noncompaction.    Spoke with patient regarding Echo results as above. Advised patient of recommendation for the Cardiac MRI, patient is in agreement with this. Advised patient that the order has been placed and that someone will reach out to her to schedule this. Advised patient that after Cardiac MRI is scheduled we will schedule a follow up appointment for her to return to discuss results with Dr. Jacques Navy. Advised patient to call back if any questions or concerns.  Patient verbalized understanding of all instructions and Echo results.   Order placed for Cardiac MRI.

## 2020-03-14 ENCOUNTER — Telehealth: Payer: Self-pay | Admitting: Internal Medicine

## 2020-03-14 NOTE — Telephone Encounter (Signed)
Called to discuss preferred weekdays and times for scheduling the Cardiac MRI ordered by Dr. Langston Masker did not answer and voice mail was full,  Will continue to try and reach patient.

## 2020-03-29 ENCOUNTER — Encounter: Payer: Self-pay | Admitting: Internal Medicine

## 2020-03-29 NOTE — Telephone Encounter (Signed)
Called to inform patient of Cardiac MRI appointment scheduled 04/03/20 at 7:00 am at [facility] phone voice mail is full----call to home number could not be completed.  Will keep trying to reach patient

## 2020-03-29 NOTE — Telephone Encounter (Signed)
Spoke with Tyna Jaksch (patient's mother) regarding appointment for Cardiac MRI scheduled Tuesday 04/03/20 at 7:00 am at [facility]---arrival time is 6:30 am 1st floor admissions office for check in.WIll print instruction letter and give to [name]  So she can speak with Nena Polio.

## 2020-03-30 ENCOUNTER — Encounter: Payer: Self-pay | Admitting: Internal Medicine

## 2020-04-02 ENCOUNTER — Telehealth (HOSPITAL_COMMUNITY): Payer: Self-pay | Admitting: Emergency Medicine

## 2020-04-02 ENCOUNTER — Ambulatory Visit: Payer: 59 | Admitting: Nurse Practitioner

## 2020-04-02 NOTE — Telephone Encounter (Signed)
Attempted to call patient regarding upcoming cardiac MR appointment. Left message on voicemail with name and callback number Tzvi Economou RN Navigator Cardiac Imaging [facility] [phone] Office [phone] Cell  
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
## 2020-04-03 ENCOUNTER — Other Ambulatory Visit: Payer: Self-pay

## 2020-04-03 ENCOUNTER — Ambulatory Visit (HOSPITAL_COMMUNITY)
Admission: RE | Admit: 2020-04-03 | Discharge: 2020-04-03 | Disposition: A | Discharge status: Home / Self Care | Payer: 59 | Source: Ambulatory Visit | Attending: Internal Medicine | Admitting: Internal Medicine

## 2020-04-03 DIAGNOSIS — Z8279 Family history of other congenital malformations, deformations and chromosomal abnormalities: Secondary | ICD-10-CM | POA: Diagnosis not present

## 2020-04-03 DIAGNOSIS — I428 Other cardiomyopathies: Secondary | ICD-10-CM | POA: Diagnosis not present

## 2020-04-03 MED ORDER — GADOBUTROL 1 MMOL/ML IV SOLN
5.0000 mL | Freq: Once | INTRAVENOUS | Status: AC | PRN
  Administered 2020-04-03: 5 mL via INTRAVENOUS

## 2020-04-23 ENCOUNTER — Ambulatory Visit: Payer: 59 | Admitting: Nurse Practitioner

## 2020-05-16 ENCOUNTER — Ambulatory Visit: Payer: 59 | Admitting: Nurse Practitioner

## 2020-05-16 ENCOUNTER — Telehealth: Payer: Self-pay

## 2020-05-16 NOTE — Telephone Encounter (Signed)
Pt mailbox was full, made a call to notify about provider being out of the office today.

## 2020-05-29 ENCOUNTER — Encounter: Payer: Self-pay | Admitting: Internal Medicine

## 2020-06-05 ENCOUNTER — Ambulatory Visit: Payer: 59 | Admitting: Nurse Practitioner

## 2020-06-18 ENCOUNTER — Other Ambulatory Visit: Payer: Self-pay

## 2020-06-18 ENCOUNTER — Encounter: Payer: Self-pay | Admitting: Nurse Practitioner

## 2020-06-18 ENCOUNTER — Ambulatory Visit (INDEPENDENT_AMBULATORY_CARE_PROVIDER_SITE_OTHER): Payer: 59 | Admitting: Nurse Practitioner

## 2020-06-18 ENCOUNTER — Other Ambulatory Visit (HOSPITAL_COMMUNITY)
Admission: RE | Admit: 2020-06-18 | Discharge: 2020-06-18 | Disposition: A | Discharge status: Home / Self Care | Payer: 59 | Source: Ambulatory Visit | Attending: Nurse Practitioner | Admitting: Nurse Practitioner

## 2020-06-18 VITALS — BP 110/70 | HR 80 | Temp 99.1°F | Ht 64.6 in | Wt 110.6 lb

## 2020-06-18 DIAGNOSIS — Z113 Encounter for screening for infections with a predominantly sexual mode of transmission: Secondary | ICD-10-CM | POA: Diagnosis not present

## 2020-06-18 DIAGNOSIS — R109 Unspecified abdominal pain: Secondary | ICD-10-CM | POA: Diagnosis not present

## 2020-06-18 DIAGNOSIS — Z202 Contact with and (suspected) exposure to infections with a predominantly sexual mode of transmission: Secondary | ICD-10-CM | POA: Diagnosis not present

## 2020-06-18 MED ORDER — AZITHROMYCIN 250 MG PO TABS: ORAL_TABLET | ORAL | 0 refills | Status: AC
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
## 2020-06-20 ENCOUNTER — Ambulatory Visit (INDEPENDENT_AMBULATORY_CARE_PROVIDER_SITE_OTHER): Payer: 59

## 2020-06-20 ENCOUNTER — Other Ambulatory Visit: Payer: Self-pay | Admitting: Nurse Practitioner

## 2020-06-20 ENCOUNTER — Other Ambulatory Visit: Payer: Self-pay

## 2020-06-20 VITALS — Temp 97.7°F | Ht 64.6 in | Wt 112.6 lb

## 2020-06-20 DIAGNOSIS — A599 Trichomoniasis, unspecified: Secondary | ICD-10-CM

## 2020-06-20 DIAGNOSIS — A549 Gonococcal infection, unspecified: Secondary | ICD-10-CM

## 2020-06-20 DIAGNOSIS — N760 Acute vaginitis: Secondary | ICD-10-CM

## 2020-06-20 DIAGNOSIS — B9689 Other specified bacterial agents as the cause of diseases classified elsewhere: Secondary | ICD-10-CM

## 2020-06-20 DIAGNOSIS — A749 Chlamydial infection, unspecified: Secondary | ICD-10-CM

## 2020-06-20 LAB — CERVICOVAGINAL ANCILLARY ONLY
Bacterial Vaginitis (gardnerella): POSITIVE — AB
Candida Glabrata: NEGATIVE
Candida Vaginitis: NEGATIVE
Chlamydia: POSITIVE — AB
Comment: NEGATIVE
Comment: NEGATIVE
Comment: NEGATIVE
Comment: NEGATIVE
Comment: NEGATIVE
Comment: NORMAL
Neisseria Gonorrhea: POSITIVE — AB
Trichomonas: POSITIVE — AB

## 2020-06-20 MED ORDER — DOXYCYCLINE HYCLATE 100 MG PO TABS: 100.0000 mg | ORAL_TABLET | Freq: Two times a day (BID) | ORAL | 0 refills | Status: DC

## 2020-06-20 MED ORDER — METRONIDAZOLE 500 MG PO TABS: 500.0000 mg | ORAL_TABLET | Freq: Three times a day (TID) | ORAL | 0 refills | Status: AC

## 2020-06-20 MED ORDER — CEFTRIAXONE SODIUM 500 MG IJ SOLR
500.0000 mg | Freq: Once | INTRAMUSCULAR | Status: AC
  Administered 2020-06-20: 500 mg via INTRAMUSCULAR

## 2020-06-20 NOTE — Progress Notes (Signed)
Called patient to give results of STD panel, also discussed with her the risk of having difficulty with having children with multiple pelvic infections. She is also having stomach pain will order a transvaginal ultrasound. She will come to office for a Rocephin injection.

## 2020-06-20 NOTE — Progress Notes (Signed)
Patient presented today for a rocephin shot. YL,RMA

## 2020-06-22 ENCOUNTER — Other Ambulatory Visit: Payer: Self-pay | Admitting: Nurse Practitioner

## 2020-06-22 DIAGNOSIS — R1030 Lower abdominal pain, unspecified: Secondary | ICD-10-CM

## 2020-06-22 DIAGNOSIS — A749 Chlamydial infection, unspecified: Secondary | ICD-10-CM

## 2020-08-27 DIAGNOSIS — T7421XA Adult sexual abuse, confirmed, initial encounter: Secondary | ICD-10-CM | POA: Diagnosis not present

## 2020-08-27 DIAGNOSIS — S8001XA Contusion of right knee, initial encounter: Secondary | ICD-10-CM | POA: Diagnosis not present

## 2020-08-27 DIAGNOSIS — S61305A Unspecified open wound of left ring finger with damage to nail, initial encounter: Secondary | ICD-10-CM | POA: Diagnosis not present

## 2020-08-27 DIAGNOSIS — S8002XA Contusion of left knee, initial encounter: Secondary | ICD-10-CM | POA: Diagnosis not present

## 2020-08-27 DIAGNOSIS — N760 Acute vaginitis: Secondary | ICD-10-CM | POA: Diagnosis not present

## 2020-08-28 ENCOUNTER — Other Ambulatory Visit: Payer: Self-pay

## 2020-08-28 ENCOUNTER — Emergency Department (HOSPITAL_COMMUNITY)
Admission: EM | Admit: 2020-08-28 | Discharge: 2020-08-29 | Disposition: A | Discharge status: Home / Self Care | Payer: 59 | Attending: Emergency Medicine | Admitting: Emergency Medicine

## 2020-08-28 DIAGNOSIS — T7421XA Adult sexual abuse, confirmed, initial encounter: Secondary | ICD-10-CM | POA: Insufficient documentation

## 2020-08-28 DIAGNOSIS — T7421XD Adult sexual abuse, confirmed, subsequent encounter: Secondary | ICD-10-CM
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
## 2020-08-29 ENCOUNTER — Other Ambulatory Visit (HOSPITAL_COMMUNITY): Payer: Self-pay

## 2020-08-29 DIAGNOSIS — T7421XA Adult sexual abuse, confirmed, initial encounter: Secondary | ICD-10-CM | POA: Diagnosis not present

## 2020-08-29 LAB — COMPREHENSIVE METABOLIC PANEL
ALT: 8 U/L (ref 0–44)
AST: 16 U/L (ref 15–41)
Albumin: 3.7 g/dL (ref 3.5–5.0)
Alkaline Phosphatase: 28 U/L — ABNORMAL LOW (ref 38–126)
Anion gap: 6 (ref 5–15)
BUN: 11 mg/dL (ref 6–20)
CO2: 24 mmol/L (ref 22–32)
Calcium: 9.1 mg/dL (ref 8.9–10.3)
Chloride: 110 mmol/L (ref 98–111)
Creatinine, Ser: 0.78 mg/dL (ref 0.44–1.00)
GFR, Estimated: >60 mL/min (ref >60)
Glucose, Bld: 91 mg/dL (ref 70–99)
Potassium: 3.8 mmol/L (ref 3.5–5.1)
Sodium: 140 mmol/L (ref 135–145)
Total Bilirubin: 0.4 mg/dL (ref 0.3–1.2)
Total Protein: 6.2 g/dL — ABNORMAL LOW (ref 6.5–8.1)

## 2020-08-29 LAB — RAPID HIV SCREEN (HIV 1/2 AB+AG)
HIV 1/2 Antibodies: NONREACTIVE
HIV-1 P24 Antigen - HIV24: NONREACTIVE

## 2020-08-29 LAB — HEPATITIS B SURFACE ANTIGEN: Hepatitis B Surface Ag: NONREACTIVE

## 2020-08-29 LAB — HEPATITIS C ANTIBODY: HCV Ab: NONREACTIVE

## 2020-08-29 LAB — POC URINE PREG, ED: Preg Test, Ur: NEGATIVE

## 2020-08-29 LAB — RPR: RPR Ser Ql: NONREACTIVE

## 2020-08-29 MED ORDER — ELVITEG-COBIC-EMTRICIT-TENOFAF 150-150-200-10 MG PREPACK
1.0000 | ORAL_TABLET | Freq: Once | ORAL | Status: AC
  Administered 2020-08-29: 1 via ORAL

## 2020-08-29 MED ORDER — ELVITEG-COBIC-EMTRICIT-TENOFAF 150-150-200-10 MG PO TABS: 1.0000 | ORAL_TABLET | Freq: Every day | ORAL | 0 refills | Status: DC

## 2020-08-29 MED ORDER — ELVITEG-COBIC-EMTRICIT-TENOFAF 150-150-200-10 MG PO TABS
1.0000 | ORAL_TABLET | Freq: Every day | ORAL | 0 refills | Status: DC
Start: 2020-08-29 — End: 2020-09-06

## 2020-08-29 MED ORDER — ELVITEG-COBIC-EMTRICIT-TENOFAF 150-150-200-10 MG PREPACK
1.0000 | ORAL_TABLET | Freq: Once | ORAL | Status: DC
  Filled 2020-08-29: qty 1

## 2020-08-29 MED ORDER — ELVITEG-COBIC-EMTRICIT-TENOFAF 150-150-200-10 MG PO TABS
ORAL_TABLET | ORAL | 0 refills | Status: DC
  Filled 2020-08-29: qty 30, 30d supply, fill #0

## 2020-08-29 NOTE — ED Notes (Signed)
Patient verbalizes understanding of discharge instructions. Prescriptions reviewed.  Opportunity for questioning and answers were provided. Armband removed by staff, pt discharged from ED via wheelchair with family.  
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
## 2020-09-03 ENCOUNTER — Other Ambulatory Visit (HOSPITAL_COMMUNITY): Payer: Self-pay

## 2020-09-06 ENCOUNTER — Ambulatory Visit (INDEPENDENT_AMBULATORY_CARE_PROVIDER_SITE_OTHER): Payer: 59 | Admitting: Nurse Practitioner

## 2020-09-06 ENCOUNTER — Other Ambulatory Visit: Payer: Self-pay

## 2020-09-06 ENCOUNTER — Encounter: Payer: Self-pay | Admitting: Nurse Practitioner

## 2020-09-06 VITALS — BP 102/70 | HR 92 | Temp 98.4°F | Ht 64.6 in | Wt 109.8 lb

## 2020-09-06 DIAGNOSIS — T7421XD Adult sexual abuse, confirmed, subsequent encounter: Secondary | ICD-10-CM | POA: Diagnosis not present

## 2020-09-06 MED ORDER — ELVITEG-COBIC-EMTRICIT-TENOFAF 150-150-200-10 MG PO TABS: 1.0000 | ORAL_TABLET | Freq: Every day | ORAL | 2 refills | Status: DC
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
## 2020-09-26 ENCOUNTER — Other Ambulatory Visit (HOSPITAL_COMMUNITY)
Admission: RE | Admit: 2020-09-26 | Discharge: 2020-09-26 | Disposition: A | Discharge status: Home / Self Care | Payer: 59 | Source: Ambulatory Visit | Attending: Nurse Practitioner | Admitting: Nurse Practitioner

## 2020-09-26 ENCOUNTER — Encounter: Payer: Self-pay | Admitting: Nurse Practitioner

## 2020-09-26 ENCOUNTER — Other Ambulatory Visit: Payer: Self-pay

## 2020-09-26 ENCOUNTER — Ambulatory Visit (INDEPENDENT_AMBULATORY_CARE_PROVIDER_SITE_OTHER): Payer: 59 | Admitting: Nurse Practitioner

## 2020-09-26 VITALS — BP 112/70 | HR 60 | Temp 99.3°F | Wt 113.0 lb

## 2020-09-26 DIAGNOSIS — Z113 Encounter for screening for infections with a predominantly sexual mode of transmission: Secondary | ICD-10-CM

## 2020-09-26 DIAGNOSIS — R8290 Unspecified abnormal findings in urine: Secondary | ICD-10-CM | POA: Diagnosis not present

## 2020-09-26 DIAGNOSIS — M25532 Pain in left wrist: Secondary | ICD-10-CM | POA: Diagnosis not present

## 2020-09-26 DIAGNOSIS — N898 Other specified noninflammatory disorders of vagina: Secondary | ICD-10-CM | POA: Diagnosis not present

## 2020-09-26 LAB — POCT URINALYSIS DIPSTICK
Bilirubin, UA: NEGATIVE
Glucose, UA: NEGATIVE
Ketones, UA: NEGATIVE
Nitrite, UA: NEGATIVE
Protein, UA: NEGATIVE
Spec Grav, UA: 1.025 (ref 1.010–1.025)
Urobilinogen, UA: 0.2 E.U./dL
pH, UA: 7 (ref 5.0–8.0)

## 2020-09-26 MED ORDER — METRONIDAZOLE 0.75 % VA GEL: 1.0000 | Freq: Two times a day (BID) | VAGINAL | 0 refills | Status: DC

## 2020-09-26 MED ORDER — PREDNISONE 10 MG (21) PO TBPK: ORAL_TABLET | ORAL | 0 refills | Status: DC

## 2020-09-26 NOTE — Progress Notes (Signed)
I,Yamilka Roman [employer] as a Neurosurgeon for [employer], FNP.,have documented all relevant documentation on the behalf of Arnette Felts, FNP,as directed by  Arnette Felts, FNP while in the presence of Arnette Felts, FNP.  This visit occurred during the SARS-CoV-2 public health emergency.  Safety protocols were in place, including screening questions prior to the visit, additional usage of staff PPE, and extensive cleaning of exam room while observing appropriate contact time as indicated for disinfecting solutions.  Subjective:     Patient ID: Beth Warren , female    DOB: May 13, 2000 , 20 y.o.   MRN: [id number]   Chief Complaint  Patient presents with   Wrist Pain    Patient stated her left wrist started hurting yesterday. She has a sharp pain when she moves it.     HPI  Wrist Pain  The pain is present in the right wrist. This is a new problem. The current episode started yesterday. The problem occurs intermittently. The quality of the pain is described as aching and sharp. The pain is at a severity of 8/10. The pain is mild. Pertinent negatives include no fever. She has tried nothing for the symptoms. The treatment provided no relief. There is no history of diabetes.    No past medical history on file.   Family History  Problem Relation Age of Onset   GER disease Mother    Asthma Brother      Current Outpatient Medications:    elvitegravir-cobicistat-emtricitabine-tenofovir (GENVOYA) 150-150-200-10 MG TABS tablet, Take 1 tablet by mouth daily with breakfast., Disp: 30 tablet, Rfl: 2   metroNIDAZOLE (METROGEL VAGINAL) 0.75 % vaginal gel, Place 1 Applicatorful vaginally 2 (two) times daily., Disp: 70 g, Rfl: 0   predniSONE (STERAPRED UNI-PAK 21 TAB) 10 MG (21) TBPK tablet, Take as directed, Disp: 21 tablet, Rfl: 0   No Known Allergies   Review of Systems  Constitutional: Negative.  Negative for fever.  Respiratory: Negative.    Cardiovascular: Negative.    Psychiatric/Behavioral: Negative.      Today's Vitals   09/26/20 1546  BP: 112/70  Pulse: 60  Temp: 99.3 F (37.4 C)  Weight: 113 lb (51.3 kg)  PainSc: 0-No pain   Body mass index is 19.04 kg/m.   Objective:  Physical Exam Vitals reviewed.  Constitutional:      General: She is not in acute distress.    Appearance: Normal appearance.  Cardiovascular:     Rate and Rhythm: Normal rate and regular rhythm.     Pulses: Normal pulses.     Heart sounds: Normal heart sounds. No murmur heard. Pulmonary:     Effort: Pulmonary effort is normal. No respiratory distress.     Breath sounds: Normal breath sounds. No wheezing.  Musculoskeletal:        General: Tenderness (left wrist tenderness with range of motion) present. No swelling, deformity or signs of injury.  Neurological:     General: No focal deficit present.     Mental Status: She is alert and oriented to person, place, and time.     Cranial Nerves: No cranial nerve deficit.     Motor: No weakness.  Psychiatric:        Mood and Affect: Mood normal.        Behavior: Behavior normal.        Thought Content: Thought content normal.        Judgment: Judgment normal.        Assessment And Plan:     1.  Left wrist pain Tenderness with range of motion, negative for swelling Negative phalen and tinel Will treat with steroid dose pack due to affecting daily lifestyle - predniSONE (STERAPRED UNI-PAK 21 TAB) 10 MG (21) TBPK tablet; Take as directed  Dispense: 21 tablet; Refill: 0  2. Screen for STD (sexually transmitted disease) - Cervicovaginal ancillary only  3. Vaginal discharge Due to previous history of bacterial vaginosis will treat with metrogel and she will be out of town by the time the results come - POCT Urinalysis Dipstick (81002) - Cervicovaginal ancillary only - metroNIDAZOLE (METROGEL VAGINAL) 0.75 % vaginal gel; Place 1 Applicatorful vaginally 2 (two) times daily.  Dispense: 70 g; Refill: 0  4. Abnormal  urine odor Urinalysis is normal - Urine Culture    Patient was given opportunity to ask questions. Patient verbalized understanding of the plan and was able to repeat key elements of the plan. All questions were answered to their satisfaction.  Arnette Felts, FNP    I, Arnette Felts, FNP, have reviewed all documentation for this visit. The documentation on 09/26/20 for the exam, diagnosis, procedures, and orders are all accurate and complete.   IF YOU HAVE BEEN REFERRED TO A SPECIALIST, IT MAY TAKE 1-2 WEEKS TO SCHEDULE/PROCESS THE REFERRAL. IF YOU HAVE NOT HEARD FROM US/SPECIALIST IN TWO WEEKS, PLEASE GIVE [place] A CALL AT [phone].   THE PATIENT IS ENCOURAGED TO PRACTICE SOCIAL DISTANCING DUE TO THE COVID-19 PANDEMIC.

## 2020-09-28 LAB — CERVICOVAGINAL ANCILLARY ONLY
Bacterial Vaginitis (gardnerella): POSITIVE — AB
Candida Glabrata: NEGATIVE
Candida Vaginitis: NEGATIVE
Chlamydia: NEGATIVE
Comment: NEGATIVE
Comment: NEGATIVE
Comment: NEGATIVE
Comment: NEGATIVE
Comment: NEGATIVE
Comment: NORMAL
Neisseria Gonorrhea: NEGATIVE
Trichomonas: NEGATIVE

## 2020-09-28 LAB — URINE CULTURE

## 2020-10-21 ENCOUNTER — Encounter: Payer: Self-pay | Admitting: Nurse Practitioner

## 2020-11-19 ENCOUNTER — Ambulatory Visit: Payer: 59 | Admitting: Nurse Practitioner

## 2020-11-21 ENCOUNTER — Ambulatory Visit: Payer: 59 | Admitting: Nurse Practitioner

## 2020-11-28 ENCOUNTER — Ambulatory Visit: Payer: 59 | Admitting: Nurse Practitioner

## 2020-12-25 ENCOUNTER — Ambulatory Visit (INDEPENDENT_AMBULATORY_CARE_PROVIDER_SITE_OTHER): Payer: 59 | Admitting: Internal Medicine

## 2020-12-25 ENCOUNTER — Other Ambulatory Visit: Payer: Self-pay

## 2020-12-25 ENCOUNTER — Other Ambulatory Visit (HOSPITAL_COMMUNITY)
Admission: RE | Admit: 2020-12-25 | Discharge: 2020-12-25 | Disposition: A | Discharge status: Home / Self Care | Payer: 59 | Source: Ambulatory Visit | Attending: Internal Medicine | Admitting: Internal Medicine

## 2020-12-25 ENCOUNTER — Encounter: Payer: Self-pay | Admitting: Internal Medicine

## 2020-12-25 VITALS — BP 110/74 | HR 80 | Temp 98.7°F | Ht 64.6 in | Wt 113.4 lb

## 2020-12-25 DIAGNOSIS — Z01419 Encounter for gynecological examination (general) (routine) without abnormal findings: Secondary | ICD-10-CM

## 2020-12-25 DIAGNOSIS — Z0000 Encounter for general adult medical examination without abnormal findings: Secondary | ICD-10-CM | POA: Diagnosis not present

## 2020-12-25 DIAGNOSIS — Z7251 High risk heterosexual behavior: Secondary | ICD-10-CM

## 2020-12-25 DIAGNOSIS — Z2821 Immunization not carried out because of patient refusal: Secondary | ICD-10-CM

## 2020-12-25 LAB — POC HEMOCCULT BLD/STL (OFFICE/1-CARD/DIAGNOSTIC): Fecal Occult Blood, POC: NEGATIVE

## 2020-12-25 NOTE — Patient Instructions (Signed)
Health Maintenance, Female Adopting a healthy lifestyle and getting preventive care are important in promoting health and wellness. Ask your health care provider about: The right schedule for you to have regular tests and exams. Things you can do on your own to prevent diseases and keep yourself healthy. What should I know about diet, weight, and exercise? Eat a healthy diet  Eat a diet that includes plenty of vegetables, fruits, low-fat dairy products, and lean protein. Do not eat a lot of foods that are high in solid fats, added sugars, or sodium. Maintain a healthy weight Body mass index (BMI) is used to identify weight problems. It estimates body fat based on height and weight. Your health care provider can help determine your BMI and help you achieve or maintain a healthy weight. Get regular exercise Get regular exercise. This is one of the most important things you can do for your health. Most adults should: Exercise for at least 150 minutes each week. The exercise should increase your heart rate and make you sweat (moderate-intensity exercise). Do strengthening exercises at least twice a week. This is in addition to the moderate-intensity exercise. Spend less time sitting. Even light physical activity can be beneficial. Watch cholesterol and blood lipids Have your blood tested for lipids and cholesterol at 20 years of age, then have this test every 5 years. Have your cholesterol levels checked more often if: Your lipid or cholesterol levels are high. You are older than 21 years of age. You are at high risk for heart disease. What should I know about cancer screening? Depending on your health history and family history, you may need to have cancer screening at various ages. This may include screening for: Breast cancer. Cervical cancer. Colorectal cancer. Skin cancer. Lung cancer. What should I know about heart disease, diabetes, and high blood pressure? Blood pressure and heart  disease High blood pressure causes heart disease and increases the risk of stroke. This is more likely to develop in people who have high blood pressure readings, are of African descent, or are overweight. Have your blood pressure checked: Every 3-5 years if you are 18-39 years of age. Every year if you are 40 years old or older. Diabetes Have regular diabetes screenings. This checks your fasting blood sugar level. Have the screening done: Once every three years after age 40 if you are at a normal weight and have a low risk for diabetes. More often and at a younger age if you are overweight or have a high risk for diabetes. What should I know about preventing infection? Hepatitis B If you have a higher risk for hepatitis B, you should be screened for this virus. Talk with your health care provider to find out if you are at risk for hepatitis B infection. Hepatitis C Testing is recommended for: Everyone born from 1945 through 1965. Anyone with known risk factors for hepatitis C. Sexually transmitted infections (STIs) Get screened for STIs, including gonorrhea and chlamydia, if: You are sexually active and are younger than 21 years of age. You are older than 21 years of age and your health care provider tells you that you are at risk for this type of infection. Your sexual activity has changed since you were last screened, and you are at increased risk for chlamydia or gonorrhea. Ask your health care provider if you are at risk. Ask your health care provider about whether you are at high risk for HIV. Your health care provider may recommend a prescription medicine   to help prevent HIV infection. If you choose to take medicine to prevent HIV, you should first get tested for HIV. You should then be tested every 3 months for as long as you are taking the medicine. Pregnancy If you are about to stop having your period (premenopausal) and you may become pregnant, seek counseling before you get  pregnant. Take 400 to 800 micrograms (mcg) of folic acid every day if you become pregnant. Ask for birth control (contraception) if you want to prevent pregnancy. Osteoporosis and menopause Osteoporosis is a disease in which the bones lose minerals and strength with aging. This can result in bone fractures. If you are 65 years old or older, or if you are at risk for osteoporosis and fractures, ask your health care provider if you should: Be screened for bone loss. Take a calcium or vitamin D supplement to lower your risk of fractures. Be given hormone replacement therapy (HRT) to treat symptoms of menopause. Follow these instructions at home: Lifestyle Do not use any products that contain nicotine or tobacco, such as cigarettes, e-cigarettes, and chewing tobacco. If you need help quitting, ask your health care provider. Do not use street drugs. Do not share needles. Ask your health care provider for help if you need support or information about quitting drugs. Alcohol use Do not drink alcohol if: Your health care provider tells you not to drink. You are pregnant, may be pregnant, or are planning to become pregnant. If you drink alcohol: Limit how much you use to 0-1 drink a day. Limit intake if you are breastfeeding. Be aware of how much alcohol is in your drink. In the [place] one drink equals one 12 oz bottle of beer (355 mL), one 5 oz glass of wine (148 mL), or one 1 oz glass of hard liquor (44 mL). General instructions Schedule regular health, dental, and eye exams. Stay current with your vaccines. Tell your health care provider if: You often feel depressed. You have ever been abused or do not feel safe at home. Summary Adopting a healthy lifestyle and getting preventive care are important in promoting health and wellness. Follow your health care provider's instructions about healthy diet, exercising, and getting tested or screened for diseases. Follow your health care provider's  instructions on monitoring your cholesterol and blood pressure. This information is not intended to replace advice given to you by your health care provider. Make sure you discuss any questions you have with your health care provider. Document Revised: 06/08/2020 Document Reviewed: 03/24/2018 Elsevier Patient Education  2022 Elsevier Inc.  

## 2020-12-25 NOTE — Progress Notes (Signed)
I,Yamilka Roman [employer] as a Education administrator for Maximino Greenland, MD.,have documented all relevant documentation on the behalf of Maximino Greenland, MD,as directed by  Maximino Greenland, MD while in the presence of Maximino Greenland, MD.  This visit occurred during the SARS-CoV-2 public health emergency.  Safety protocols were in place, including screening questions prior to the visit, additional usage of staff PPE, and extensive cleaning of exam room while observing appropriate contact time as indicated for disinfecting solutions.  Subjective:     Patient ID: Beth Warren , female    DOB: 09/12/2000 , 20 y.o.   MRN: [id number]   Chief Complaint  Patient presents with   Annual Exam    HPI  Patient here for hm. Patient stated she does not have a GYN. She wants to have a pap smear today. Also wants to be checked for STDs. 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      Past Medical History:  Diagnosis Date   Bacterial vaginosis    H/O gonorrhea      Family History  Problem Relation Age of Onset   GER disease Mother    Asthma Brother      Current Outpatient Medications:    elvitegravir-cobicistat-emtricitabine-tenofovir (GENVOYA) 150-150-200-10 MG TABS tablet, Take 1 tablet by mouth daily with breakfast. (Patient not taking: Reported on 12/25/2020), Disp: 30 tablet, Rfl: 2   metroNIDAZOLE (METROGEL VAGINAL) 0.75 % vaginal gel, Place 1 Applicatorful vaginally 2 (two) times daily. (Patient not taking: Reported on 12/25/2020), Disp: 70 g, Rfl: 0   No Known Allergies    The patient states she uses none for birth control. Last LMP was Patient's last menstrual period was 11/26/2020.. Negative for Dysmenorrhea. Negative for: breast discharge, breast lump(s), breast pain and breast self exam. Associated symptoms include abnormal vaginal bleeding. Pertinent negatives include abnormal bleeding (hematology), anxiety, decreased libido, depression, difficulty falling sleep, dyspareunia, history of  infertility, nocturia, sexual dysfunction, sleep disturbances, urinary incontinence, urinary urgency, vaginal discharge and vaginal itching. Diet regular.The patient states her exercise level is  intermittent.  . The patient's tobacco use is:  Social History   Tobacco Use  Smoking Status Never  Smokeless Tobacco Never  . She has been exposed to passive smoke. The patient's alcohol use is:  Social History   Substance and Sexual Activity  Alcohol Use None   Review of Systems  Constitutional: Negative.   HENT: Negative.    Eyes: Negative.   Respiratory: Negative.    Cardiovascular: Negative.   Gastrointestinal: Negative.   Endocrine: Negative.   Genitourinary:  Positive for vaginal discharge and vaginal pain.  Musculoskeletal: Negative.   Skin: Negative.   Allergic/Immunologic: Negative.   Neurological: Negative.   Hematological: Negative.   Psychiatric/Behavioral: Negative.      Today's Vitals   12/25/20 1414  BP: 110/74  Pulse: 80  Temp: 98.7 F (37.1 C)  Weight: 113 lb 6.4 oz (51.4 kg)  Height: 5' 4.6" (1.641 m)  PainSc: 0-No pain   Body mass index is 19.11 kg/m.  Wt Readings from Last 3 Encounters:  12/25/20 113 lb 6.4 oz (51.4 kg)  09/26/20 113 lb (51.3 kg)  09/06/20 109 lb 12.8 oz (49.8 kg)     Objective:  Physical Exam Vitals and nursing note reviewed. Exam conducted with a chaperone present.  Constitutional:      Appearance: Normal appearance.  HENT:     Head: Normocephalic and atraumatic.     Right Ear: Tympanic membrane, ear canal  and external ear normal.     Left Ear: Tympanic membrane, ear canal and external ear normal.     Nose:     Comments: Masked     Mouth/Throat:     Comments: Masked  Eyes:     Extraocular Movements: Extraocular movements intact.     Conjunctiva/sclera: Conjunctivae normal.     Pupils: Pupils are equal, round, and reactive to light.  Cardiovascular:     Rate and Rhythm: Normal rate and regular rhythm.     Pulses: Normal  pulses.     Heart sounds: Normal heart sounds.  Pulmonary:     Effort: Pulmonary effort is normal.     Breath sounds: Normal breath sounds.  Chest:  Breasts:    Tanner Score is 5.     Right: Normal.     Left: Normal.  Abdominal:     General: Abdomen is flat. Bowel sounds are normal.     Palpations: Abdomen is soft.  Genitourinary:    General: Normal vulva.     Exam position: Lithotomy position.     Tanner stage (genital): 5.     Vagina: Vaginal discharge present.     Cervix: Normal.     Uterus: Normal.      Adnexa: Right adnexa normal and left adnexa normal.     Rectum: Normal. Guaiac result negative.     Comments: deferred Musculoskeletal:        General: Normal range of motion.     Cervical back: Normal range of motion and neck supple.  Lymphadenopathy:     Lower Body: No right inguinal adenopathy. No left inguinal adenopathy.  Skin:    General: Skin is warm and dry.     Comments: Scattered tattoos on b/l UE, LE, anterior/posterior torso  Neurological:     General: No focal deficit present.     Mental Status: She is alert and oriented to person, place, and time.  Psychiatric:        Mood and Affect: Mood normal.        Behavior: Behavior normal.        Assessment And Plan:     1. Encounter for general adult medical examination w/o abnormal findings Comments: A full exam was performed. Importance of monthly self breast exams was discussed with the patient. PATIENT IS ADVISED TO GET 30-45 MINUTES REGULAR EXERCISE NO LESS THAN FOUR TO FIVE DAYS PER WEEK - BOTH WEIGHTBEARING EXERCISES AND AEROBIC ARE RECOMMENDED.  PATIENT IS ADVISED TO FOLLOW A HEALTHY DIET WITH AT LEAST SIX FRUITS/VEGGIES PER DAY, DECREASE INTAKE OF RED MEAT, AND TO INCREASE FISH INTAKE TO TWO DAYS PER WEEK.  MEATS/FISH SHOULD NOT BE FRIED, BAKED OR BROILED IS PREFERABLE.  IT IS ALSO IMPORTANT TO CUT BACK ON YOUR SUGAR INTAKE. PLEASE AVOID ANYTHING WITH ADDED SUGAR, CORN SYRUP OR OTHER SWEETENERS. IF YOU  MUST USE A SWEETENER, YOU CAN TRY STEVIA. IT IS ALSO IMPORTANT TO AVOID ARTIFICIALLY SWEETENERS AND DIET BEVERAGES. LASTLY, I SUGGEST WEARING SPF 50 SUNSCREEN ON EXPOSED PARTS AND ESPECIALLY WHEN IN THE DIRECT SUNLIGHT FOR AN EXTENDED PERIOD OF TIME.  PLEASE AVOID FAST FOOD RESTAURANTS AND INCREASE YOUR WATER INTAKE.  - CMP14+EGFR - CBC - Lipid panel - POC Hemoccult Bld/Stl (1-Cd Office Dx)  2. Encntr for gyn exam (general) (routine) w/o abn findings Comments: Pap smear performed, stool is heme negative.  - POC Hemoccult Bld/Stl (1-Cd Office Dx) - Cytology -Pap Smear  3. High risk heterosexual behavior Comments: I will check  Nuswab plus and serum HIV, RPR. HCV, and HBV.  - NuSwab Vaginitis Plus (VG+) - Hepatitis B Surface Antigen - HIV antibody (with reflex) - Hepatitis C antibody - RPR  4. Influenza vaccination declined  Patient was given opportunity to ask questions. Patient verbalized understanding of the plan and was able to repeat key elements of the plan. All questions were answered to their satisfaction.   I, Maximino Greenland, MD, have reviewed all documentation for this visit. The documentation on 12/31/20 for the exam, diagnosis, procedures, and orders are all accurate and complete.   THE PATIENT IS ENCOURAGED TO PRACTICE SOCIAL DISTANCING DUE TO THE COVID-19 PANDEMIC.

## 2020-12-26 ENCOUNTER — Other Ambulatory Visit: Payer: Self-pay | Admitting: Internal Medicine

## 2020-12-27 ENCOUNTER — Other Ambulatory Visit: Payer: Self-pay | Admitting: Internal Medicine

## 2020-12-27 ENCOUNTER — Other Ambulatory Visit: Payer: Self-pay

## 2020-12-27 DIAGNOSIS — Z01419 Encounter for gynecological examination (general) (routine) without abnormal findings: Secondary | ICD-10-CM

## 2020-12-27 DIAGNOSIS — Z7251 High risk heterosexual behavior: Secondary | ICD-10-CM

## 2020-12-31 ENCOUNTER — Encounter: Payer: Self-pay | Admitting: Internal Medicine

## 2020-12-31 LAB — CERVICOVAGINAL ANCILLARY ONLY
Bacterial Vaginitis (gardnerella): POSITIVE — AB
Candida Glabrata: NEGATIVE
Candida Vaginitis: POSITIVE — AB
Chlamydia: NEGATIVE
Comment: NEGATIVE
Comment: NEGATIVE
Comment: NEGATIVE
Comment: NEGATIVE
Comment: NEGATIVE
Comment: NORMAL
Neisseria Gonorrhea: POSITIVE — AB
Trichomonas: POSITIVE — AB

## 2020-12-31 LAB — CYTOLOGY - PAP: Diagnosis: NEGATIVE

## 2021-01-01 ENCOUNTER — Ambulatory Visit (INDEPENDENT_AMBULATORY_CARE_PROVIDER_SITE_OTHER): Payer: 59 | Admitting: Internal Medicine

## 2021-01-01 ENCOUNTER — Encounter: Payer: Self-pay | Admitting: Internal Medicine

## 2021-01-01 ENCOUNTER — Other Ambulatory Visit: Payer: Self-pay

## 2021-01-01 VITALS — BP 108/66 | HR 82 | Temp 99.4°F | Ht 64.6 in | Wt 112.6 lb

## 2021-01-01 DIAGNOSIS — A549 Gonococcal infection, unspecified: Secondary | ICD-10-CM

## 2021-01-01 DIAGNOSIS — B373 Candidiasis of vulva and vagina: Secondary | ICD-10-CM

## 2021-01-01 DIAGNOSIS — B3731 Acute candidiasis of vulva and vagina: Secondary | ICD-10-CM

## 2021-01-01 DIAGNOSIS — A599 Trichomoniasis, unspecified: Secondary | ICD-10-CM | POA: Diagnosis not present

## 2021-01-01 MED ORDER — FLUCONAZOLE 150 MG PO TABS: ORAL_TABLET | ORAL | 0 refills | Status: DC

## 2021-01-01 MED ORDER — TIOCONAZOLE 6.5 % VA OINT: TOPICAL_OINTMENT | VAGINAL | 0 refills | Status: DC

## 2021-01-01 MED ORDER — CEFTRIAXONE SODIUM 500 MG IJ SOLR
500.0000 mg | Freq: Once | INTRAMUSCULAR | Status: AC
  Administered 2021-01-01: 500 mg via INTRAMUSCULAR

## 2021-01-01 NOTE — Progress Notes (Signed)
I,[name],acting as a Neurosurgeon for Gwynneth Aliment, MD.,have documented all relevant documentation on the behalf of Gwynneth Aliment, MD,as directed by  Gwynneth Aliment, MD while in the presence of Gwynneth Aliment, MD.  This visit occurred during the SARS-CoV-2 public health emergency.  Safety protocols were in place, including screening questions prior to the visit, additional usage of staff PPE, and extensive cleaning of exam room while observing appropriate contact time as indicated for disinfecting solutions.  Subjective:     Patient ID: Beth Warren , female    DOB: 01-03-2001 , 20 y.o.   MRN: [id number]   Chief Complaint  Patient presents with   std treatment    HPI  The patient is here today for STD treatment.  She was tested for STDs at her most recent visit and tested positive for gonorrhea, trichomonas, candida and BV. She admits to being sexually active, yet states her partner wears condoms.     Past Medical History:  Diagnosis Date   Bacterial vaginosis    H/O gonorrhea      Family History  Problem Relation Age of Onset   GER disease Mother    Asthma Brother      Current Outpatient Medications:    tioconazole (VAGISTAT) 6.5 % vaginal ointment, Insert into vagina daily x 3 days, Disp: 8 g, Rfl: 0   elvitegravir-cobicistat-emtricitabine-tenofovir (GENVOYA) 150-150-200-10 MG TABS tablet, Take 1 tablet by mouth daily with breakfast. (Patient not taking: No sig reported), Disp: 30 tablet, Rfl: 2   fluconazole (DIFLUCAN) 150 MG tablet, One tab po today, repeat in 48 hours, Disp: 2 tablet, Rfl: 0   No Known Allergies   Review of Systems  Constitutional: Negative.   Respiratory: Negative.    Cardiovascular: Negative.   Gastrointestinal: Negative.   Neurological: Negative.   Psychiatric/Behavioral: Negative.      Today's Vitals   01/01/21 1101  BP: 108/66  Pulse: 82  Temp: 99.4 F (37.4 C)  TempSrc: Oral  Weight: 112 lb 9.6 oz (51.1 kg)  Height: 5'  4.6" (1.641 m)   Body mass index is 18.97 kg/m.   Objective:  Physical Exam Vitals and nursing note reviewed.  Constitutional:      Appearance: Normal appearance.  HENT:     Head: Normocephalic and atraumatic.     Nose:     Comments: Masked     Mouth/Throat:     Comments: Masked  Cardiovascular:     Rate and Rhythm: Normal rate and regular rhythm.     Heart sounds: Normal heart sounds.  Pulmonary:     Effort: Pulmonary effort is normal.     Breath sounds: Normal breath sounds.  Musculoskeletal:     Cervical back: Normal range of motion.  Skin:    General: Skin is warm.  Neurological:     General: No focal deficit present.     Mental Status: She is alert.  Psychiatric:        Mood and Affect: Mood normal.        Behavior: Behavior normal.        Assessment And Plan:     1. Gonorrhea Comments: She was given Rocephin, 500mg  IM x1. Advised to avoid sex for two weeks and to take all meds as prescribed. Safe sex practices were d/w pt. She agrees to rto in 3 weeks for repeat testing to document resolution.  - cefTRIAXone (ROCEPHIN) injection 500 mg  2. Trichomonas infection Comments: She was given rx tioconazole, this  should also address BV. REminded to avoid sex for the next two weeks. Advised to have partner treated.   3. Candida vaginitis Comments: I will send rx fluconazole.    Patient was given opportunity to ask questions. Patient verbalized understanding of the plan and was able to repeat key elements of the plan. All questions were answered to their satisfaction.   I, Gwynneth Aliment, MD, have reviewed all documentation for this visit. The documentation on 01/17/21 for the exam, diagnosis, procedures, and orders are all accurate and complete.   IF YOU HAVE BEEN REFERRED TO A SPECIALIST, IT MAY TAKE 1-2 WEEKS TO SCHEDULE/PROCESS THE REFERRAL. IF YOU HAVE NOT HEARD FROM US/SPECIALIST IN TWO WEEKS, PLEASE GIVE [place] A CALL AT [phone] X [phone].   THE PATIENT IS  ENCOURAGED TO PRACTICE SOCIAL DISTANCING DUE TO THE COVID-19 PANDEMIC.

## 2021-01-08 ENCOUNTER — Telehealth: Payer: Self-pay

## 2021-01-08 NOTE — Telephone Encounter (Signed)
The pt was notified that her appt couldn't be moved up to an earlier date, it would be to soon to retest.

## 2021-01-23 ENCOUNTER — Other Ambulatory Visit: Payer: Self-pay

## 2021-01-23 ENCOUNTER — Ambulatory Visit (INDEPENDENT_AMBULATORY_CARE_PROVIDER_SITE_OTHER): Payer: 59 | Admitting: Nurse Practitioner

## 2021-01-23 ENCOUNTER — Encounter: Payer: Self-pay | Admitting: Nurse Practitioner

## 2021-01-23 ENCOUNTER — Ambulatory Visit: Payer: 59 | Admitting: Nurse Practitioner

## 2021-01-23 VITALS — BP 110/62 | HR 60 | Temp 98.7°F | Ht 64.6 in | Wt 112.8 lb

## 2021-01-23 DIAGNOSIS — A549 Gonococcal infection, unspecified: Secondary | ICD-10-CM | POA: Diagnosis not present

## 2021-01-23 DIAGNOSIS — Z7251 High risk heterosexual behavior: Secondary | ICD-10-CM | POA: Diagnosis not present

## 2021-01-23 DIAGNOSIS — A749 Chlamydial infection, unspecified: Secondary | ICD-10-CM | POA: Diagnosis not present

## 2021-01-23 NOTE — Progress Notes (Signed)
I,Yamilka J Llittleton,acting as a Neurosurgeon for [employer], FNP.,have documented all relevant documentation on the behalf of Arnette Felts, FNP,as directed by  Arnette Felts, FNP while in the presence of Arnette Felts, FNP.   This visit occurred during the SARS-CoV-2 public health emergency.  Safety protocols were in place, including screening questions prior to the visit, additional usage of staff PPE, and extensive cleaning of exam room while observing appropriate contact time as indicated for disinfecting solutions.  Subjective:     Patient ID: Beth Warren , female    DOB: [birth year] , 20 y.o.   MRN: [id number]   Chief Complaint  Patient presents with   std retest    HPI  Patient presents today for a retest.  She continues to have vaginal discharge (white) without an odor. She also took diflucan 2 tabs and yeast cream. Denies vaginal burning.      Past Medical History:  Diagnosis Date   Bacterial vaginosis    H/O gonorrhea      Family History  Problem Relation Age of Onset   GER disease Mother    Asthma Brother      Current Outpatient Medications:    elvitegravir-cobicistat-emtricitabine-tenofovir (GENVOYA) 150-150-200-10 MG TABS tablet, Take 1 tablet by mouth daily with breakfast. (Patient not taking: No sig reported), Disp: 30 tablet, Rfl: 2   No Known Allergies   Review of Systems  Constitutional: Negative.   Respiratory: Negative.    Cardiovascular: Negative.   Psychiatric/Behavioral: Negative.      Today's Vitals   01/23/21 1432  BP: 110/62  Pulse: 60  Temp: 98.7 F (37.1 C)  Weight: 112 lb 12.8 oz (51.2 kg)  Height: 5' 4.6" (1.641 m)  PainSc: 0-No pain   Body mass index is 19 kg/m.   Objective:  Physical Exam Vitals reviewed.  Constitutional:      General: She is not in acute distress.    Appearance: Normal appearance.  Pulmonary:     Effort: Pulmonary effort is normal. No respiratory distress.  Neurological:     General: No focal deficit  present.     Mental Status: She is alert and oriented to person, place, and time.     Cranial Nerves: No cranial nerve deficit.     Motor: No weakness.  Psychiatric:        Mood and Affect: Mood normal.        Behavior: Behavior normal.        Thought Content: Thought content normal.        Judgment: Judgment normal.        Assessment And Plan:     1. Gonorrhea Comments: She was treated with Rocephin at her last visit, will recheck nuswab. Unable to use speculum patient is resistant to procudure - NuSwab Vaginitis Plus (VG+)  2. Chlamydia Comments: She was treated at her last visit continues to have a white discharge, will recheck NuSwab today.  - NuSwab Vaginitis Plus (VG+)  3. High risk heterosexual behavior - NuSwab Vaginitis Plus (VG+)    Patient was given opportunity to ask questions. Patient verbalized understanding of the plan and was able to repeat key elements of the plan. All questions were answered to their satisfaction.  Arnette Felts, FNP   I, Arnette Felts, FNP, have reviewed all documentation for this visit. The documentation on 01/23/21 for the exam, diagnosis, procedures, and orders are all accurate and complete.   IF YOU HAVE BEEN REFERRED TO A SPECIALIST, IT MAY TAKE 1-2 WEEKS TO  SCHEDULE/PROCESS THE REFERRAL. IF YOU HAVE NOT HEARD FROM US/SPECIALIST IN TWO WEEKS, PLEASE GIVE [place] A CALL AT [phone].   THE PATIENT IS ENCOURAGED TO PRACTICE SOCIAL DISTANCING DUE TO THE COVID-19 PANDEMIC.

## 2021-01-28 LAB — NUSWAB VAGINITIS PLUS (VG+)
Candida albicans, NAA: NEGATIVE
Candida glabrata, NAA: NEGATIVE
Chlamydia trachomatis, NAA: POSITIVE — AB
Megasphaera 1: HIGH Score — AB
Neisseria gonorrhoeae, NAA: NEGATIVE
Trich vag by NAA: POSITIVE — AB

## 2021-01-29 ENCOUNTER — Ambulatory Visit: Payer: 59 | Admitting: Nurse Practitioner

## 2021-02-07 ENCOUNTER — Ambulatory Visit: Payer: 59 | Admitting: Nurse Practitioner

## 2021-02-14 ENCOUNTER — Other Ambulatory Visit: Payer: Self-pay

## 2021-02-14 ENCOUNTER — Ambulatory Visit: Payer: 59 | Admitting: Nurse Practitioner

## 2021-02-14 MED ORDER — AZITHROMYCIN 250 MG PO TABS: ORAL_TABLET | ORAL | 0 refills | Status: DC

## 2021-02-14 MED ORDER — METRONIDAZOLE 500 MG PO TABS: 500.0000 mg | ORAL_TABLET | Freq: Two times a day (BID) | ORAL | 0 refills | Status: AC

## 2021-02-18 ENCOUNTER — Telehealth: Payer: Self-pay

## 2021-02-18 NOTE — Telephone Encounter (Signed)
The pt was notified of her next appt date and time.

## 2021-03-13 ENCOUNTER — Telehealth: Payer: Self-pay | Admitting: Nurse Practitioner

## 2021-03-13 NOTE — Telephone Encounter (Signed)
Returned call to Priscille Kluver at [facility] department and advised I treated her for chlamydia from 10/13 - with 1 gram of azithromycin.

## 2021-03-18 ENCOUNTER — Ambulatory Visit: Payer: 59 | Admitting: Nurse Practitioner

## 2021-04-24 ENCOUNTER — Ambulatory Visit: Payer: 59 | Admitting: Nurse Practitioner

## 2021-05-07 IMAGING — MR MR CARD MORPHOLOGY WO/W CM
45 of 48 series · 45 of 48 positions shown · IV contrast (gadavist)
Comparison: none

CLINICAL DATA: Query LV Noncompaction, family history of Bicuspid
Aortic Valve

EXAM:
CARDIAC MRI
TECHNIQUE: The patient was scanned on a 1.5 Tesla GE magnet. A dedicated
cardiac coil was used. Functional imaging was done using Fiesta
sequences. [DATE], and 4 chamber views were done to assess for RWMA's.
Modified Deondra rule using a short axis stack was used to
calculate an ejection fraction on a dedicated work station using
Circle software. The patient received 5mL GADAVIST GADOBUTROL 1
MMOL/ML IV SOLN. After 10 minutes inversion recovery sequences were
used to assess for infiltration and scar tissue.

[Series 4: t2_haste_db_tra_bh · axial · 8.0mm · 1.41mm/px · 1 of 16 slices shown]
[im 1/16]
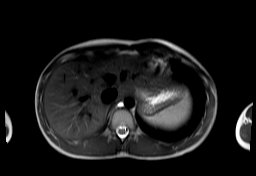

[Series 8: bSSFP · oblique · 8.0mm · 1.52mm/px · 1 of 25 slices shown (1 of 24)]
[im 1/25]
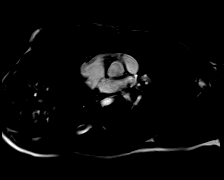

[Series 9: bSSFP · oblique · 8.0mm · 1.52mm/px · 1 of 25 slices shown (2 of 24)]
[im 1/25]
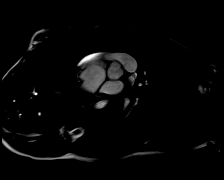

[Series 10: bSSFP · oblique · 8.0mm · 1.52mm/px · 1 of 25 slices shown (3 of 24)]
[im 1/25]
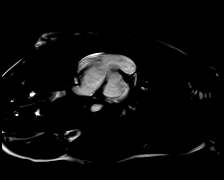

[Series 11: bSSFP · oblique · 8.0mm · 1.52mm/px · 1 of 25 slices shown (4 of 24)]
[im 1/25]
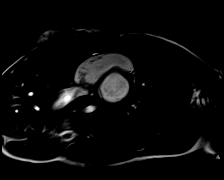

[Series 12: bSSFP · oblique · 8.0mm · 1.52mm/px · 1 of 25 slices shown (5 of 24)]
[im 1/25]
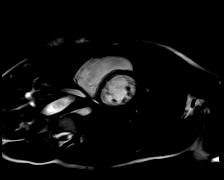

[Series 13: bSSFP · oblique · 8.0mm · 1.52mm/px · 1 of 25 slices shown (6 of 24)]
[im 1/25]
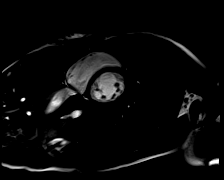

[Series 14: bSSFP · oblique · 8.0mm · 1.52mm/px · 1 of 25 slices shown (7 of 24)]
[im 1/25]
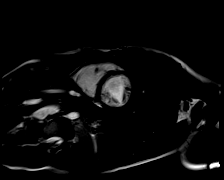

[Series 15: bSSFP · oblique · 8.0mm · 1.52mm/px · 1 of 25 slices shown (8 of 24)]
[im 1/25]
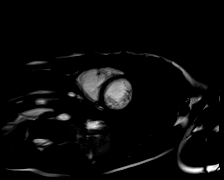

[Series 16: bSSFP · oblique · 8.0mm · 1.52mm/px · 1 of 25 slices shown (9 of 24)]
[im 1/25]
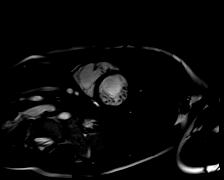

[Series 17: bSSFP · oblique · 8.0mm · 1.52mm/px · 1 of 25 slices shown (10 of 24)]
[im 1/25]
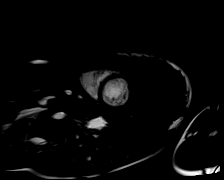

[Series 18: bSSFP · oblique · 8.0mm · 1.52mm/px · 1 of 25 slices shown (11 of 24)]
[im 1/25]
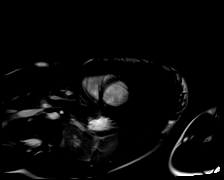

[Series 19: bSSFP · oblique · 8.0mm · 1.52mm/px · 1 of 25 slices shown (12 of 24)]
[im 1/25]
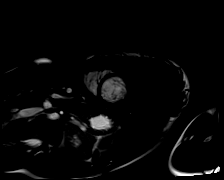

[Series 20: bSSFP · oblique · 8.0mm · 1.52mm/px · 1 of 25 slices shown (13 of 24)]
[im 1/25]
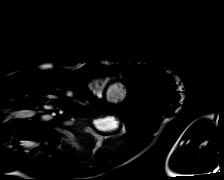

[Series 21: bSSFP · oblique · 8.0mm · 1.52mm/px · 1 of 25 slices shown (14 of 24)]
[im 1/25]
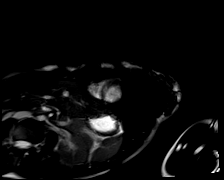

[Series 22: bSSFP · oblique · 8.0mm · 1.52mm/px · 1 of 25 slices shown (15 of 24)]
[im 1/25]
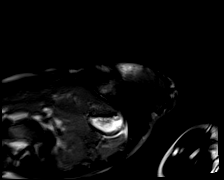

[Series 23: bSSFP · oblique · 8.0mm · 1.52mm/px · 1 of 25 slices shown (16 of 24)]
[im 1/25]
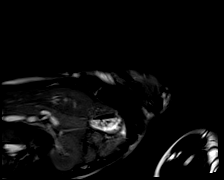

[Series 24: bSSFP · oblique · 8.0mm · 1.52mm/px · 1 of 25 slices shown (17 of 24)]
[im 1/25]
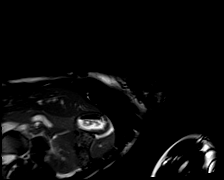

[Series 25: bSSFP · oblique · 8.0mm · 1.52mm/px · 1 of 25 slices shown (18 of 24)]
[im 1/25]
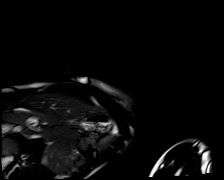

[Series 26: bSSFP · sagittal · 6.0mm · 1.41mm/px · 1 of 25 slices shown (19 of 24)]
[im 1/25]
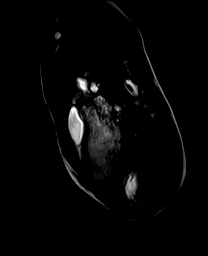

[Series 27: bSSFP · sagittal · 6.0mm · 1.33mm/px · 1 of 25 slices shown (20 of 24)]
[im 1/25]
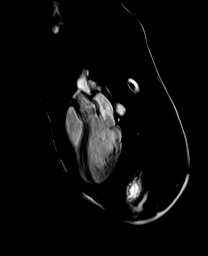

[Series 28: bSSFP · oblique · 6.0mm · 1.41mm/px · 1 of 25 slices shown (21 of 24)]
[im 1/25]
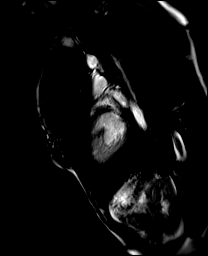

[Series 29: bSSFP · coronal · 6.0mm · 1.41mm/px · 1 of 25 slices shown (22 of 24)]
[im 1/25]
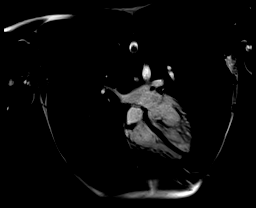

[Series 30: bSSFP · sagittal · 6.0mm · 1.33mm/px · 1 of 25 slices shown (23 of 24)]
[im 1/25]
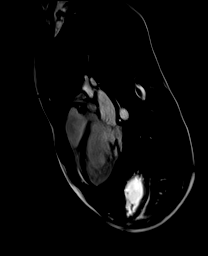

[Series 31: bSSFP · oblique · 6.0mm · 1.41mm/px · 1 of 25 slices shown (24 of 24)]
[im 1/25]
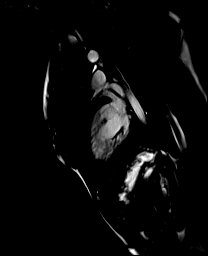

[Series 32: (id)_long_t1 · oblique · 8.0mm · 1.56mm/px · 1 of 24 slices shown]
[im 1/24]
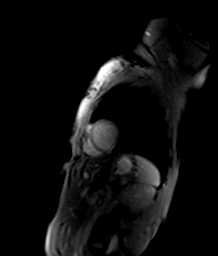

[Series 33: (id)_long_t1_moco · oblique · 8.0mm · 1.56mm/px · 1 of 24 slices shown]
[im 1/24]
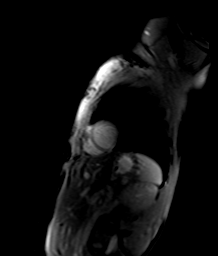

[Series 34: (id)_long_t1_moco_t1 · oblique · 8.0mm · 1.56mm/px · 1 of 6 slices shown]
[im 1/6]
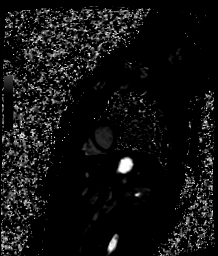

[Series 36: (id)_trufi · oblique · 8.0mm · 2.08mm/px · 1 of 9 slices shown]
[im 1/9]
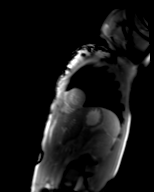

[Series 37: (id)_trufi_moco · oblique · 8.0mm · 2.08mm/px · 1 of 9 slices shown]
[im 1/9]
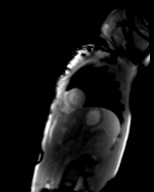

[Series 38: (id)_trufi_moco_t2 · oblique · 8.0mm · 2.08mm/px · 1 of 4 slices shown]
[im 1/4]
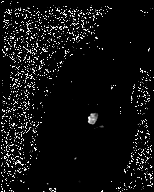

[Series 40: cine_trufi_cs_rt_short axis · oblique · 8.0mm · 1.73mm/px · 1 of 20 slices shown (1 of 14)]
[im 1/20]
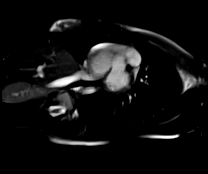

[Series 40: cine_trufi_cs_rt_short axis · oblique · 8.0mm · 1.73mm/px · 1 of 20 slices shown (2 of 14)]
[im 1/20]
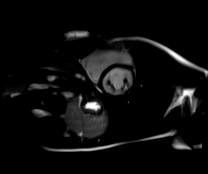

[Series 40: cine_trufi_cs_rt_short axis · oblique · 8.0mm · 1.73mm/px · 1 of 20 slices shown (3 of 14)]
[im 1/20]
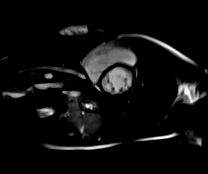

[Series 40: cine_trufi_cs_rt_short axis · oblique · 8.0mm · 1.73mm/px · 1 of 20 slices shown (4 of 14)]
[im 1/20]
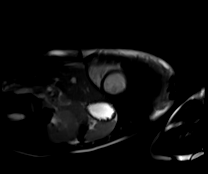

[Series 40: cine_trufi_cs_rt_short axis · oblique · 8.0mm · 1.73mm/px · 1 of 20 slices shown (5 of 14)]
[im 1/20]
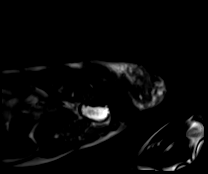

[Series 40: cine_trufi_cs_rt_short axis · oblique · 8.0mm · 1.73mm/px · 1 of 20 slices shown (6 of 14)]
[im 1/20]
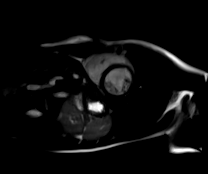

[Series 40: cine_trufi_cs_rt_short axis · oblique · 8.0mm · 1.73mm/px · 1 of 20 slices shown (7 of 14)]
[im 1/20]
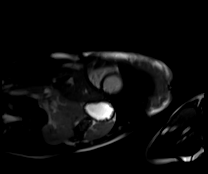

[Series 40: cine_trufi_cs_rt_short axis · oblique · 8.0mm · 1.73mm/px · 1 of 20 slices shown (8 of 14)]
[im 1/20]
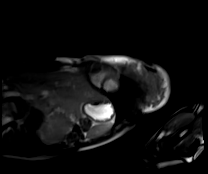

[Series 40: cine_trufi_cs_rt_short axis · oblique · 8.0mm · 1.73mm/px · 1 of 20 slices shown (9 of 14)]
[im 1/20]
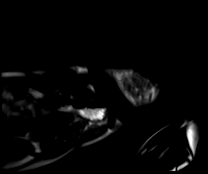

[Series 40: cine_trufi_cs_rt_short axis · oblique · 8.0mm · 1.73mm/px · 1 of 20 slices shown (10 of 14)]
[im 1/20]
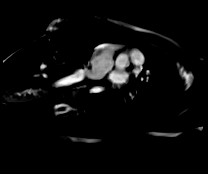

[Series 40: cine_trufi_cs_rt_short axis · oblique · 8.0mm · 1.73mm/px · 1 of 20 slices shown (11 of 14)]
[im 1/20]
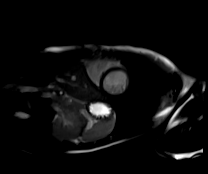

[Series 40: cine_trufi_cs_rt_short axis · oblique · 8.0mm · 1.73mm/px · 1 of 20 slices shown (12 of 14)]
[im 1/20]
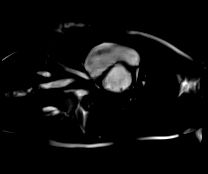

[Series 40: cine_trufi_cs_rt_short axis · oblique · 8.0mm · 1.73mm/px · 1 of 20 slices shown (13 of 14)]
[im 1/20]
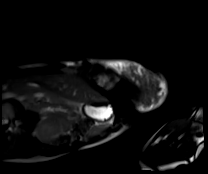

[Series 40: cine_trufi_cs_rt_short axis · oblique · 8.0mm · 1.73mm/px · 1 of 20 slices shown (14 of 14)]
[im 1/20]
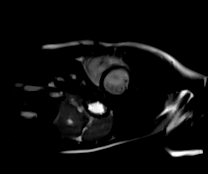

[45 of 48 positions shown; findings below may reference images not displayed]

FINDINGS: LEFT VENTRICLE:

Normal left ventricular chamber size.

Normal left ventricular wall thickness.

Normal left ventricular systolic function.

LVEF = 60%

There are no regional wall motion abnormalities.

Hypertrabeculations in the left ventricle from midventricle to apex,
in a ratio of approximately [DATE] along the lateral wall.

T2 value within normal range, 47 msec, suggesting no myocardial
edema.

There is no post contrast delayed myocardial enhancement.

Normal T1 myocardial nulling kinetics suggest against a diagnosis of
cardiac amyloidosis.

ECV = 26%, normal range.

RIGHT VENTRICLE:

Normal right ventricular chamber size.

Normal right ventricular wall thickness.

Normal right ventricular systolic function.

RVEF = 48%

There are no regional wall motion abnormalities.

No post contrast delayed myocardial enhancement.

ATRIA:

Normal left atrial size.

Normal right atrial size.

VALVES:

The aortic valve is suboptimally visualized but appears tricuspid in
short axis images.

No significant valvular abnormalities.

PERICARDIUM:

Normal pericardium.  No pericardial effusion.

AORTA:

Normal caliber mid ascending aorta measured in axial plane.

OTHER: No significant extracardiac findings.

MEASUREMENTS:
Phase contrast AV and PV performed but not reported due to
significant respiratory motion artifact.

Left ventricle:

LV Female

LV EF: 60% (normal 56-78%)

Absolute volumes:

LV EDV: 123mL (Normal 52-141 mL)

LV ESV: 49mL (Normal 13-51 mL)

LV SV: 74mL (Normal 33-97 mL)

CO: 4.0L/min (normal 2.7-6.0 L/min)

Indexed volumes:

LV EDV: 82mL/sq-m (Normal 41-81 mL/sq-m)

LV ESV: 33mL/sq-m (Normal 12-21 mL/sq-m)

LV SV: 49mL/sq-m (normal 26-56 mL/sq-m)

CI: 2.65L/min/sq-m (Normal 1.8-3.8 L/min/sq-m)

Right ventricle:

RV female

RV EF: 48% (normal 47-80%)

Absolute volumes:

RV EDV: 146 mL (Normal 58-154 mL)

RV ESV: 76 mL (Normal 12-68 mL)

RV SV: 69 mL (Normal 35-98 mL)

CO: 3.7 L/min (Normal 2.7-6 L/min)

Indexed volumes:

RV EDV: 96 ML/sq-m (Normal 48-87 mL/sq-m)

RV ESV: 51 mL/sq-m (Normal 11-28 mL/sq-m)

RV SV: 46 mL/sq-m (Normal 27-57 mL/sq-m)

CI: 2.47 L/min/sq-m (Normal 1.8-3.8 L/min/sq-m)
IMPRESSION: 1. Normal biventricular chamber size and function, LVEF 60%, RVEF
48%.

2. Hypertrabeculation in the LV mid-ventricle and apex, with normal
LV size and function, may be normal variant anatomy.

3.  No delayed myocardial enhancement or myocardial edema.

4. Aortic valve appears tricuspid.

## 2021-05-16 ENCOUNTER — Ambulatory Visit: Payer: 59 | Admitting: Nurse Practitioner

## 2021-05-16 ENCOUNTER — Other Ambulatory Visit (HOSPITAL_COMMUNITY)
Admission: RE | Admit: 2021-05-16 | Discharge: 2021-05-16 | Disposition: A | Discharge status: Home / Self Care | Payer: 59 | Source: Ambulatory Visit | Attending: Nurse Practitioner | Admitting: Nurse Practitioner

## 2021-05-16 ENCOUNTER — Encounter: Payer: Self-pay | Admitting: Nurse Practitioner

## 2021-05-16 ENCOUNTER — Other Ambulatory Visit: Payer: Self-pay

## 2021-05-16 ENCOUNTER — Ambulatory Visit (INDEPENDENT_AMBULATORY_CARE_PROVIDER_SITE_OTHER): Payer: 59 | Admitting: Nurse Practitioner

## 2021-05-16 VITALS — BP 110/60 | HR 93 | Temp 98.1°F | Ht 64.6 in | Wt 117.8 lb

## 2021-05-16 DIAGNOSIS — Z7251 High risk heterosexual behavior: Secondary | ICD-10-CM

## 2021-05-16 DIAGNOSIS — Z23 Encounter for immunization: Secondary | ICD-10-CM | POA: Diagnosis not present

## 2021-05-16 DIAGNOSIS — N898 Other specified noninflammatory disorders of vagina: Secondary | ICD-10-CM | POA: Insufficient documentation

## 2021-05-16 DIAGNOSIS — R82998 Other abnormal findings in urine: Secondary | ICD-10-CM

## 2021-05-16 DIAGNOSIS — Z5320 Procedure and treatment not carried out because of patient's decision for unspecified reasons: Secondary | ICD-10-CM | POA: Diagnosis not present

## 2021-05-16 LAB — POCT URINALYSIS DIPSTICK
Bilirubin, UA: NEGATIVE
Blood, UA: NEGATIVE
Glucose, UA: NEGATIVE
Ketones, UA: NEGATIVE
Nitrite, UA: NEGATIVE
Protein, UA: NEGATIVE
Spec Grav, UA: 1.02 (ref 1.010–1.025)
Urobilinogen, UA: 0.2 E.U./dL
pH, UA: 7 (ref 5.0–8.0)

## 2021-05-16 MED ORDER — HPV 9-VALENT RECOMB VACCINE IM SUSP: 0.5000 mL | Freq: Once | INTRAMUSCULAR | 0 refills | Status: AC

## 2021-05-16 NOTE — Patient Instructions (Signed)
Preventing Sexually Transmitted Infections, Adult  Sexually transmitted infections (STIs) are diseases that are spread from person to person (are contagious). They are spread, or transmitted, through bodily fluids exchanged during sex or sexual contact. These bodily fluids include saliva, semen, blood, vaginal mucus, and urine. STIs are very common among people of all ages.  Some common STIs include:  Herpes.  Hepatitis B.  Chlamydia.  Gonorrhea.  Syphilis.  HPV (human papillomavirus).  HIV, also called the human immunodeficiency virus. This is the virus that can cause AIDS (acquired immunodeficiency syndrome).  Often, people who have these STIs do not have symptoms. Even without symptoms, these infections can be spread from person to person and require treatment.  How can these conditions affect me?  STIs can be treated, and many STIs can be cured. However, some STIs cannot be cured and will affect you for the rest of your life.  Certain STIs may:  Require you to take medicine for the rest of your life.  Affect your ability to have children (your fertility).  Increase your risk for developing another STI or certain serious health conditions. These may include:  Cervical cancer.  Head and neck cancer.  Pelvic inflammatory disease (PID), in women.  Organ damage or damage to other parts of your body, if the infection spreads.  Cause problems during pregnancy and may be transmitted to the baby during the pregnancy or childbirth.  What can increase my risk?  You may have an increased risk for developing an STI if:  You have unprotected sex. Sex includes oral, vaginal, or anal sex.  You have more than one sex partner.  You have a sex partner who has multiple sex partners.  You have sex with someone who has an STI.  You have an STI or you had an STI before.  You inject drugs or have a sex partner or partners who inject drugs.  What actions can I take to prevent STIs?  The only way to completely prevent STIs is not to have  sex of any kind. This is called practicing abstinence. If you are sexually active, you can protect yourself and others by taking these actions to lower your risk of getting an STI:  Lifestyle  Avoid mixing alcohol, drugs, and sex. Alcohol and drug use can affect your ability to make good decisions and can lead to risky sexual behaviors.  Medicines  Ask your health care provider about taking pre-exposure prophylaxis (PrEP) to prevent HIV infection.  General information    Stay up to date on vaccinations. Certain vaccines can lower your risk of getting certain STIs, such as:  Hepatitis A and hepatitis B vaccines. You may have been vaccinated as a young child, but you will likely need a booster shot as a teen or young adult.  HPV (human papillomavirus) vaccine.  Have only one sex partner (be monogamous) or limit the number of sex partners you have.  Use methods that prevent the exchange of body fluids between partners (barrier protection) correctly every time you have sex. Barrier protection can be used during oral, vaginal, or anal sex. Commonly used barrier methods include:  Female condom.  Female condom.  Dental dam.  Use a new condom for every sex act from start to finish.  Get tested for STIs. Have your partners get tested, too.  If you test positive for an STI, follow recommendations from your health care provider about treatment and make sure your sex partners are tested and treated.  Birth control   pills, injections, implants, and intrauterine devices (IUDs) do not protect against STIs. To prevent both STIs and pregnancy, always use a condom with another form of birth control.  Some STIs, such as herpes, are spread through skin-to-skin contact. A condom may not protect you from getting such STIs. Avoid all sexual contact if you or your partners have herpes and there is an active flare with open sores.  Where to find more information  Learn more about STIs from:  Centers for Disease Control and Prevention:  More  information about specific STIs: cdc.gov/std  Places to get sexual health counseling and treatment for free or at a low cost: gettested.cdc.gov  [place] Department of Health and Human Services: www.womenshealth.gov  Summary  Sexually transmitted infections (STIs) can spread through exchanging bodily fluids during sexual contact. Fluids include saliva, semen, blood, vaginal mucus, and urine.  You may have an increased risk for developing an STI if you have unprotected sex. Sex includes oral, vaginal, or anal sex.  If you do have sex, limit your number of sex partners and use barrier protection every time you have sex.  This information is not intended to replace advice given to you by your health care provider. Make sure you discuss any questions you have with your health care provider.  Document Revised: 05/16/2019 Document Reviewed: 05/16/2019  Elsevier Patient Education  2022 [employer] Inc.

## 2021-05-16 NOTE — Addendum Note (Signed)
Addended by: Arnette Felts F on: 05/16/2021 05:47 PM   Modules accepted: Orders

## 2021-05-16 NOTE — Progress Notes (Signed)
I,Tianna Badgett,acting as a Neurosurgeon for [employer], FNP.,have documented all relevant documentation on the behalf of Arnette Felts, FNP,as directed by  Arnette Felts, FNP while in the presence of Arnette Felts, FNP.  This visit occurred during the SARS-CoV-2 public health emergency.  Safety protocols were in place, including screening questions prior to the visit, additional usage of staff PPE, and extensive cleaning of exam room while observing appropriate contact time as indicated for disinfecting solutions.  Subjective:     Patient ID: Beth Warren , female    DOB: [birth year] , 21 y.o.   MRN: [id number]   Chief Complaint  Patient presents with   Exposure to STD         HPI  Patient is here for recheck STD.  She would like to be tested for everything today. She was treated again last with azithromycin and flagyl for chlamydia and gonorrhea. She reports her partner was treated as well, still has the same partner.  LMP - 05/11/21  Exposure to STD  The patient's primary symptoms include a discharge. This is a recurrent problem. The vaginal discharge was white. Pertinent negatives include no abdominal pain. She has tried nothing for the symptoms. Risk factors include history of STDs.    Past Medical History:  Diagnosis Date   Bacterial vaginosis    H/O gonorrhea      Family History  Problem Relation Age of Onset   GER disease Mother    Asthma Brother      Current Outpatient Medications:    hpv 9-valent vaccine (GARDASIL 9) SUSP injection, Inject 0.5 mLs into the muscle once for 1 dose., Disp: 0.5 mL, Rfl: 0   No Known Allergies   Review of Systems  Constitutional: Negative.   Respiratory: Negative.    Cardiovascular: Negative.   Gastrointestinal: Negative.  Negative for abdominal pain.  Neurological: Negative.     Today's Vitals   05/16/21 1212  BP: 110/60  Pulse: 93  Temp: 98.1 F (36.7 C)  TempSrc: Oral  Weight: 117 lb 12.8 oz (53.4 kg)  Height: 5' 4.6" (1.641  m)   Body mass index is 19.85 kg/m.   Objective:  Physical Exam Vitals reviewed.  Constitutional:      General: She is not in acute distress.    Appearance: Normal appearance.  Pulmonary:     Effort: Pulmonary effort is normal. No respiratory distress.  Genitourinary:    Vagina: No vaginal discharge.  Neurological:     General: No focal deficit present.     Mental Status: She is alert and oriented to person, place, and time.     Cranial Nerves: No cranial nerve deficit.     Motor: No weakness.  Psychiatric:        Mood and Affect: Mood normal.        Behavior: Behavior normal.        Thought Content: Thought content normal.        Judgment: Judgment normal.        Assessment And Plan:     1. Vaginal discharge Comments: No vaginal discharge present to vaginal area, rechecked for STI's - POCT Urinalysis Dipstick (81002)  2. Urine white blood cells increased Comments: Small white cells to urine will send for culture - Urine Culture  3. High risk heterosexual behavior  4. Encounter for immunization Comments: Discussed HPV vaccine and sent Rx.  - hpv 9-valent vaccine (GARDASIL 9) SUSP injection; Inject 0.5 mLs into the muscle once for 1 dose.  Dispense: 0.5 mL; Refill: 0  5. HIV screening declined    Patient was given opportunity to ask questions. Patient verbalized understanding of the plan and was able to repeat key elements of the plan. All questions were answered to their satisfaction.  Arnette Felts, FNP   I, Arnette Felts, FNP, have reviewed all documentation for this visit. The documentation on 05/16/21 for the exam, diagnosis, procedures, and orders are all accurate and complete.   IF YOU HAVE BEEN REFERRED TO A SPECIALIST, IT MAY TAKE 1-2 WEEKS TO SCHEDULE/PROCESS THE REFERRAL. IF YOU HAVE NOT HEARD FROM US/SPECIALIST IN TWO WEEKS, PLEASE GIVE [place] A CALL AT [phone].   THE PATIENT IS ENCOURAGED TO PRACTICE SOCIAL DISTANCING DUE TO THE COVID-19  PANDEMIC.

## 2021-05-20 LAB — CERVICOVAGINAL ANCILLARY ONLY
Bacterial Vaginitis (gardnerella): POSITIVE — AB
Chlamydia: NEGATIVE
Comment: NEGATIVE
Comment: NEGATIVE
Comment: NEGATIVE
Comment: NORMAL
Neisseria Gonorrhea: NEGATIVE
Trichomonas: NEGATIVE

## 2021-05-21 ENCOUNTER — Other Ambulatory Visit: Payer: Self-pay | Admitting: Nurse Practitioner

## 2021-05-21 DIAGNOSIS — B9689 Other specified bacterial agents as the cause of diseases classified elsewhere: Secondary | ICD-10-CM

## 2021-05-21 MED ORDER — METRONIDAZOLE 500 MG PO TABS: 500.0000 mg | ORAL_TABLET | Freq: Three times a day (TID) | ORAL | 0 refills | Status: DC

## 2021-05-23 ENCOUNTER — Other Ambulatory Visit: Payer: Self-pay

## 2021-05-23 DIAGNOSIS — B9689 Other specified bacterial agents as the cause of diseases classified elsewhere: Secondary | ICD-10-CM

## 2021-05-23 MED ORDER — METRONIDAZOLE 500 MG PO TABS: 500.0000 mg | ORAL_TABLET | Freq: Three times a day (TID) | ORAL | 0 refills | Status: AC

## 2021-08-15 ENCOUNTER — Ambulatory Visit (INDEPENDENT_AMBULATORY_CARE_PROVIDER_SITE_OTHER): Payer: 59 | Admitting: Nurse Practitioner

## 2021-08-15 ENCOUNTER — Encounter: Payer: Self-pay | Admitting: Nurse Practitioner

## 2021-08-15 ENCOUNTER — Other Ambulatory Visit (HOSPITAL_COMMUNITY)
Admission: RE | Admit: 2021-08-15 | Discharge: 2021-08-15 | Disposition: A | Discharge status: Home / Self Care | Payer: 59 | Source: Ambulatory Visit | Attending: Nurse Practitioner | Admitting: Nurse Practitioner

## 2021-08-15 VITALS — BP 116/60 | HR 80 | Temp 98.8°F | Ht 64.6 in | Wt 117.0 lb

## 2021-08-15 DIAGNOSIS — Z3009 Encounter for other general counseling and advice on contraception: Secondary | ICD-10-CM

## 2021-08-15 DIAGNOSIS — N898 Other specified noninflammatory disorders of vagina: Secondary | ICD-10-CM | POA: Insufficient documentation

## 2021-08-15 DIAGNOSIS — Z23 Encounter for immunization: Secondary | ICD-10-CM

## 2021-08-15 MED ORDER — NORELGESTROMIN-ETH ESTRADIOL 150-35 MCG/24HR TD PTWK: 1.0000 | MEDICATED_PATCH | TRANSDERMAL | 12 refills | Status: DC

## 2021-08-15 MED ORDER — HPV 9-VALENT RECOMB VACCINE IM SUSP: 0.5000 mL | Freq: Once | INTRAMUSCULAR | 0 refills | Status: AC

## 2021-08-15 NOTE — Progress Notes (Signed)
?Clinical biochemist as a Neurosurgeon for [employer], FNP.,have documented all relevant documentation on the behalf of Arnette Felts, FNP,as directed by  Arnette Felts, FNP while in the presence of Arnette Felts, FNP. ? ?This visit occurred during the SARS-CoV-2 public health emergency.  Safety protocols were in place, including screening questions prior to the visit, additional usage of staff PPE, and extensive cleaning of exam room while observing appropriate contact time as indicated for disinfecting solutions. ? ?Subjective:  ?  ? Patient ID: Beth Warren , female    DOB: [birth year] , 21 y.o.   MRN: [id number] ? ? ?Chief Complaint  ?Patient presents with  ? SEXUALLY TRANSMITTED DISEASE  ? ? ?HPI ? ?Patient presents today for STD and birth control.  She had a condom stuck in her for several days. She has been having discharge. She was having abdomen cramping during the time while the condom was stuck. She is having vaginal discharge and no odor. She is currently on her menstrual cycle started on May 2nd.  She was on the depo injection.   ?  ? ?Past Medical History:  ?Diagnosis Date  ? Bacterial vaginosis   ? H/O gonorrhea   ?  ? ?Family History  ?Problem Relation Age of Onset  ? GER disease Mother   ? Asthma Brother   ? ? ? ?Current Outpatient Medications:  ?  hpv 9-valent vaccine (GARDASIL 9) SUSP injection, Inject 0.5 mLs into the muscle once for 1 dose., Disp: 0.5 mL, Rfl: 0 ?  norelgestromin-ethinyl estradiol (ORTHO EVRA) 150-35 MCG/24HR transdermal patch, Place 1 patch onto the skin once a week., Disp: 3 patch, Rfl: 12  ? ?No Known Allergies  ? ?Review of Systems  ?Constitutional: Negative.   ?Respiratory: Negative.    ?Cardiovascular: Negative.   ?Gastrointestinal: Negative.   ?Neurological: Negative.   ?Psychiatric/Behavioral: Negative.     ? ?Today's Vitals  ? 08/15/21 1442  ?BP: 116/60  ?Pulse: 80  ?Temp: 98.8 ?F (37.1 ?C)  ?TempSrc: Oral  ?Weight: 117 lb (53.1 kg)  ?Height: 5' 4.6" (1.641 m)  ? ?Body  mass index is 19.71 kg/m?.  ? ?Objective:  ?Physical Exam ?Vitals reviewed.  ?Constitutional:   ?   General: She is not in acute distress. ?   Appearance: Normal appearance.  ?Pulmonary:  ?   Effort: Pulmonary effort is normal. No respiratory distress.  ?Neurological:  ?   General: No focal deficit present.  ?   Mental Status: She is alert and oriented to person, place, and time.  ?   Cranial Nerves: No cranial nerve deficit.  ?Psychiatric:     ?   Mood and Affect: Mood normal.     ?   Behavior: Behavior normal.     ?   Thought Content: Thought content normal.     ?   Judgment: Judgment normal.  ?  ? ?   ?Assessment And Plan:  ?   ?1. Vaginal discharge ?Comments: Will check urine cytology for STDs ?- Urine cytology ancillary only ? ?2. Encounter for contraceptive planning ?Comments: Negative urine pregnancy test, will start her back on patches.  ?- norelgestromin-ethinyl estradiol (ORTHO EVRA) 150-35 MCG/24HR transdermal patch; Place 1 patch onto the skin once a week.  Dispense: 3 patch; Refill: 12 ? ?3. Encounter for immunization ?Comments: HPV vaccine rx sent to pharmacy 1 of 2 ?  ? ? ?Patient was given opportunity to ask questions. Patient verbalized understanding of the plan and was able to repeat key elements of  the plan. All questions were answered to their satisfaction.  ?Arnette Felts, FNP  ? ?I, Arnette Felts, FNP, have reviewed all documentation for this visit. The documentation on 08/15/21 for the exam, diagnosis, procedures, and orders are all accurate and complete.  ? ?IF YOU HAVE BEEN REFERRED TO A SPECIALIST, IT MAY TAKE 1-2 WEEKS TO SCHEDULE/PROCESS THE REFERRAL. IF YOU HAVE NOT HEARD FROM US/SPECIALIST IN TWO WEEKS, PLEASE GIVE [place] A CALL AT [phone].  ? ?THE PATIENT IS ENCOURAGED TO PRACTICE SOCIAL DISTANCING DUE TO THE COVID-19 PANDEMIC.   ?

## 2021-08-15 NOTE — Patient Instructions (Addendum)
Preventing Sexually Transmitted Infections, Adult Sexually transmitted infections (STIs) are spread from person to person (are contagious). They are spread, or transmitted, through bodily fluids exchanged during sex or sexual contact. These bodily fluids include saliva, semen, blood, vaginal mucus, and urine. STIs are very common and can occur among people of all ages. Some common STIs include: Herpes. Hepatitis B. Chlamydia. Gonorrhea. Syphilis. HPV (human papillomavirus). HIV, also called the human immunodeficiency virus. This is the virus that can cause AIDS (acquired immunodeficiency syndrome). Often, people who have these STIs do not have symptoms. Even without symptoms, these infections can be spread from person to person and require treatment. How can these conditions affect me? STIs can be treated, and many STIs can be cured. However, some STIs cannot be cured and will affect you for the rest of your life. Certain STIs may: Require you to take medicine for the rest of your life. Affect your ability to have children (your fertility). Increase your risk for developing another STI or certain serious health conditions. These may include: Cervical cancer. Head and neck cancer. Pelvic inflammatory disease (PID), in women. Organ damage or damage to other parts of your body, if the infection spreads. Cause problems during pregnancy and may be transmitted to the baby during the pregnancy or childbirth. What can increase my risk? You may have an increased risk for developing an STI if: You have unprotected sex or sexual contact. You have more than one sex partner. You have a sex partner who has multiple sex partners. You have sexual contact with someone who has an STI. You have an STI or you had an STI before. You inject drugs or have a sex partner or partners who inject drugs. What actions can I take to prevent STIs? The only way to completely prevent STIs is not to have sex of any  kind. This is called practicing abstinence. If you are sexually active, you can protect yourself and others by taking these actions to lower your risk of getting an STI: Lifestyle Avoid mixing alcohol, drugs, and sex. Alcohol and drug use can affect your ability to make good decisions and can lead to risky sexual behaviors. Medicines Ask your health care provider about taking pre-exposure prophylaxis (PrEP) to prevent HIV infection. General information  Stay up to date on vaccinations. Certain vaccines can lower your risk of getting certain STIs, such as: Hepatitis A and hepatitis B vaccines. HPV (human papillomavirus) vaccine. Have only one sex partner (be monogamous) or limit the number of sex partners you have. Use methods that prevent the exchange of body fluids between partners (barrier protection) correctly every time you have sexual contact. Barrier protection can be used during oral, vaginal, or anal sex. Commonly used barrier methods include: External (female) condom. Internal (female) condom. Dental dam. Use a new barrier method for every sex act from start to finish. Get tested for STIs. Have your partners get tested, too. If you test positive for an STI, follow recommendations from your health care provider about treatment and make sure your sex partners are tested and treated. Birth control pills, injections, implants, and intrauterine devices (IUDs) do not protect against STIs. To prevent both STIs and pregnancy, always use a condom with another form of birth control. Some STIs, such as herpes, are spread through skin-to-skin contact. A barrier method may not protect you from getting such STIs. Avoid all sexual contact if you or your partners have herpes and there is an active flare with open sores. Where to   find more information ?Learn more about STIs from: ?Centers for Disease Control and Prevention: ?More information about specific STIs: BloggingList.ca ?Places to get sexual health  counseling and treatment for free or at a low cost: gettested.TonerPromos.no ?[place] Department of Health and Human Services: http://hoffman.com/ ?Summary ?Sexually transmitted infections (STIs) can spread through exchanging bodily fluids during sexual contact. Fluids include saliva, semen, blood, vaginal mucus, and urine. ?You may have an increased risk for developing an STI if you have unprotected sex. ?If you do have sex, limit your number of sex partners and use barrier protection every time you have sex. ?This information is not intended to replace advice given to you by your health care provider. Make sure you discuss any questions you have with your health care provider. ?Document Revised: 03/25/2021 Document Reviewed: 05/16/2019 ?Elsevier Patient Education ? 2023 Elsevier Inc. ? ? ?Hormonal Contraception Information ?Hormonal contraception is a type of birth control that uses hormones to prevent pregnancy. It usually involves a combination of the hormones estrogen and progesterone, or only the hormone progesterone. Hormonal contraception works in these ways: ?It thickens the mucus in the cervix, which is the lowest part of the uterus. Thicker mucus makes it harder for sperm to enter the uterus. ?It changes the lining of the uterus. This makes it harder for an egg to attach or implant. ?It may stop the ovaries from releasing eggs (ovulation). Some women who take hormonal contraceptives that contain only progesterone may continue to ovulate. ?Hormonal contraception cannot prevent STIs (sexually transmitted infections). Pregnancy may still occur. ?Types of hormonal contraception ? ?Estrogen and progesterone contraceptives ?Contraceptives that use a combination of estrogen and progesterone are available in these forms: ?Pill. Pills come in different combinations of hormones. Pills must be taken at the same time each day. They can affect your period. You can get your period monthly, once every 3 months, or not at  all. ?Patch. The patch is applied to the buttocks, abdomen, upper outer arm, or back. It is kept in place for 3 weeks. It is removed for the last or fourth week of the cycle. ?Vaginal ring. The ring is placed in the vagina and left there for 3 weeks. It is then removed for the last or fourth week of the cycle. ?Progesterone-only contraceptives ?Contraceptives that use only progesterone are available in these forms: ?Pill. Pills should be taken at the same time everyday. This is very important to decrease the chance of pregnancy. Pills containing progestin-only are usually taken every day of the cycle. Other types of pills may have a placebo tablet for the last 4 days of every cycle. ?Intrauterine device (IUD). This device is inserted through the vagina and cervix into the uterus. It is removed or replaced every 3 to 5 years, depending on the type. It can be removed sooner. ?Implant. Plastic rods are placed under the skin of the upper arm. They are removed or replaced every 3 years. They can be removed sooner. ?Shot (injection). The injection is given once every 12 or 13 weeks (about 3 months). ?Risks associated with hormonal contraception ?Estrogen and progesterone contraceptives can sometimes cause side effects, such as: ?Nausea. ?Headaches. ?Breast tenderness. ?Bleeding or spotting between menstrual cycles. ?High blood pressure (rare). ?Strokes, heart attacks, or blood clots (rare). ?Progesterone-only contraceptives also can have side effects, such as: ?Nausea. ?Headaches. ?Breast tenderness. ?Irregular menstrual bleeding. ?High blood pressure (rare). ?Talk to your health care provider about what side effects may mean for you. ?Questions to ask: ?What type of hormonal  contraception is right for me? ?How long should I plan to use hormonal contraception? ?What are the side effects of the hormonal contraception method I choose? ?How can I prevent STIs while using hormonal contraception? ?Where to find more  information ?Ask your health care provider for more information and resources about hormonal contraception. You can also go to: ?[place] Department of Health and [employer], Office on Women's Health: http://hoffman.com/ ?S

## 2021-08-19 ENCOUNTER — Other Ambulatory Visit: Payer: Self-pay | Admitting: Nurse Practitioner

## 2021-08-19 DIAGNOSIS — B379 Candidiasis, unspecified: Secondary | ICD-10-CM

## 2021-08-19 LAB — URINE CYTOLOGY ANCILLARY ONLY
Bacterial Vaginitis-Urine: NEGATIVE
Candida Urine: POSITIVE — AB
Chlamydia: NEGATIVE
Comment: NEGATIVE
Comment: NEGATIVE
Comment: NORMAL
Neisseria Gonorrhea: NEGATIVE
Trichomonas: NEGATIVE

## 2021-08-19 MED ORDER — FLUCONAZOLE 100 MG PO TABS: 100.0000 mg | ORAL_TABLET | Freq: Every day | ORAL | 0 refills | Status: DC

## 2021-09-11 ENCOUNTER — Other Ambulatory Visit: Payer: Self-pay

## 2021-09-11 DIAGNOSIS — B379 Candidiasis, unspecified: Secondary | ICD-10-CM

## 2021-09-11 MED ORDER — FLUCONAZOLE 100 MG PO TABS: 100.0000 mg | ORAL_TABLET | Freq: Every day | ORAL | 0 refills | Status: DC

## 2021-10-22 ENCOUNTER — Encounter: Payer: Self-pay | Admitting: Nurse Practitioner

## 2021-10-22 ENCOUNTER — Ambulatory Visit (INDEPENDENT_AMBULATORY_CARE_PROVIDER_SITE_OTHER): Payer: 59 | Admitting: Nurse Practitioner

## 2021-10-22 VITALS — BP 108/78 | HR 72 | Temp 98.1°F | Ht 64.0 in | Wt 115.6 lb

## 2021-10-22 DIAGNOSIS — N644 Mastodynia: Secondary | ICD-10-CM

## 2021-10-22 DIAGNOSIS — M545 Low back pain, unspecified: Secondary | ICD-10-CM | POA: Diagnosis not present

## 2021-10-22 DIAGNOSIS — Z3201 Encounter for pregnancy test, result positive: Secondary | ICD-10-CM | POA: Diagnosis not present

## 2021-10-22 LAB — POCT URINE PREGNANCY: Preg Test, Ur: POSITIVE — AB

## 2021-10-22 NOTE — Patient Instructions (Addendum)
Commonly Asked Questions During Pregnancy  Cats: A parasite can be excreted in cat feces.  To avoid exposure you need to have another person empty the little box.  If you must empty the litter box you will need to wear gloves.  Wash your hands after handling your cat.  This parasite can also be found in raw or undercooked meat so this should also be avoided.  Colds, Sore Throats, Flu: Please check your medication sheet to see what you can take for symptoms.  If your symptoms are unrelieved by these medications please call the office.  Dental Work: Most any dental work your dentist recommends is permitted.  X-rays should only be taken during the first trimester if absolutely necessary.  Your abdomen should be shielded with a lead apron during all x-rays.  Please notify your provider prior to receiving any x-rays.  Novocaine is fine; gas is not recommended.  If your dentist requires a note from us prior to dental work please call the office and we will provide one for you.  Exercise: Exercise is an important part of staying healthy during your pregnancy.  You may continue most exercises you were accustomed to prior to pregnancy.  Later in your pregnancy you will most likely notice you have difficulty with activities requiring balance like riding a bicycle.  It is important that you listen to your body and avoid activities that put you at a higher risk of falling.  Adequate rest and staying well hydrated are a must!  If you have questions about the safety of specific activities ask your provider.    Exposure to Children with illness: Try to avoid obvious exposure; report any symptoms to us when noted,  If you have chicken pos, red measles or mumps, you should be immune to these diseases.   Please do not take any vaccines while pregnant unless you have checked with your OB provider.  Fetal Movement: After 28 weeks we recommend you do "kick counts" twice daily.  Lie or sit down in a calm quiet environment and  count your baby movements "kicks".  You should feel your baby at least 10 times per hour.  If you have not felt 10 kicks within the first hour get up, walk around and have something sweet to eat or drink then repeat for an additional hour.  If count remains less than 10 per hour notify your provider.  Fumigating: Follow your pest control agent's advice as to how long to stay out of your home.  Ventilate the area well before re-entering.  Hemorrhoids:   Most over-the-counter preparations can be used during pregnancy.  Check your medication to see what is safe to use.  It is important to use a stool softener or fiber in your diet and to drink lots of liquids.  If hemorrhoids seem to be getting worse please call the office.   Hot Tubs:  Hot tubs Jacuzzis and saunas are not recommended while pregnant.  These increase your internal body temperature and should be avoided.  Intercourse:  Sexual intercourse is safe during pregnancy as long as you are comfortable, unless otherwise advised by your provider.  Spotting may occur after intercourse; report any bright red bleeding that is heavier than spotting.  Labor:  If you know that you are in labor, please go to the hospital.  If you are unsure, please call the office and let us help you decide what to do.  Lifting, straining, etc:  If your job requires heavy   lifting or straining please check with your provider for any limitations.  Generally, you should not lift items heavier than that you can lift simply with your hands and arms (no back muscles)  Painting:  Paint fumes do not harm your pregnancy, but may make you ill and should be avoided if possible.  Latex or water based paints have less odor than oils.  Use adequate ventilation while painting.  Permanents & Hair Color:  Chemicals in hair dyes are not recommended as they cause increase hair dryness which can increase hair loss during pregnancy.  " Highlighting" and permanents are allowed.  Dye may be  absorbed differently and permanents may not hold as well during pregnancy.  Sunbathing:  Use a sunscreen, as skin burns easily during pregnancy.  Drink plenty of fluids; avoid over heating.  Tanning Beds:  Because their possible side effects are still unknown, tanning beds are not recommended.  Ultrasound Scans:  Routine ultrasounds are performed at approximately 20 weeks.  You will be able to see your baby's general anatomy an if you would like to know the gender this can usually be determined as well.  If it is questionable when you conceived you may also receive an ultrasound early in your pregnancy for dating purposes.  Otherwise ultrasound exams are not routinely performed unless there is a medical necessity.  Although you can request a scan we ask that you pay for it when conducted because insurance does not cover " patient request" scans.  Work: If your pregnancy proceeds without complications you may work until your due date, unless your physician or employer advises otherwise.  Round Ligament Pain/Pelvic Discomfort:  Sharp, shooting pains not associated with bleeding are fairly common, usually occurring in the second trimester of pregnancy.  They tend to be worse when standing up or when you remain standing for long periods of time.  These are the result of pressure of certain pelvic ligaments called "round ligaments".  Rest, Tylenol and heat seem to be the most effective relief.  As the womb and fetus grow, they rise out of the pelvis and the discomfort improves.  Please notify the office if your pain seems different than that described.  It may represent a more serious condition.  She is advised to get a prenatal vitamin to take daily and contact office before taking any medications. Encouraged to not do any drugs or alcohol while pregnant.

## 2021-10-22 NOTE — Progress Notes (Signed)
I,Victoria T Hamilton,acting as a Neurosurgeon for Arnette Felts, FNP.,have documented all relevant documentation on the behalf of Arnette Felts, FNP,as directed by  Arnette Felts, FNP while in the presence of Arnette Felts, FNP.  Subjective:     Patient ID: Beth Warren , female    DOB: [birth year] , 21 y.o.   MRN: [id number]   No chief complaint on file.   HPI  Pt presents today wanting to confirm pregnancy. Last menstrual cycle was June 1st. She did not start the transdermal birth control patch. Then when she did not come on July 1st she took a test on July 8th. She is having breast tenderness and low back.  She has a 22 year old. She is on the fence of her plans.   Patient reports taking pregnancy test on 10/19/2021. Both coming back positive.  She is nervous but excited.       Past Medical History:  Diagnosis Date   Bacterial vaginosis    H/O gonorrhea      Family History  Problem Relation Age of Onset   GER disease Mother    Asthma Brother      Current Outpatient Medications:    fluconazole (DIFLUCAN) 100 MG tablet, Take 1 tablet (100 mg total) by mouth daily. Take 1 tablet by mouth now repeat in 5 days, Disp: 2 tablet, Rfl: 0   No Known Allergies   Review of Systems  Constitutional: Negative.   Respiratory: Negative.    Cardiovascular: Negative.   Neurological: Negative.   Psychiatric/Behavioral: Negative.       Today's Vitals   10/22/21 1121  BP: 108/78  Pulse: 72  Temp: 98.1 F (36.7 C)  SpO2: 96%  Weight: 115 lb 9.6 oz (52.4 kg)  Height: 5\' 4"  (1.626 m)  PainSc: 0-No pain   Body mass index is 19.84 kg/m.  Wt Readings from Last 3 Encounters:  10/22/21 115 lb 9.6 oz (52.4 kg)  08/15/21 117 lb (53.1 kg)  05/16/21 117 lb 12.8 oz (53.4 kg)    Objective:  Physical Exam Vitals reviewed.  Constitutional:      General: She is not in acute distress.    Appearance: Normal appearance.  Pulmonary:     Effort: Pulmonary effort is normal. No respiratory  distress.  Neurological:     General: No focal deficit present.     Mental Status: She is alert and oriented to person, place, and time.     Cranial Nerves: No cranial nerve deficit.     Motor: No weakness.  Psychiatric:        Mood and Affect: Mood normal.        Behavior: Behavior normal.        Thought Content: Thought content normal.        Judgment: Judgment normal.         Assessment And Plan:     1. Breast tenderness Comments: positive urine pregnancy test, LMP June 1st.   2. Acute bilateral low back pain without sciatica - POCT Urine Pregnancy  3. Positive pregnancy test Comments: She is advised to get a prenatal vitamin to take daily and contact office before taking any medications. Encouraged to not do any drugs or alcohol while pregnant. She is to call to office to inform of which ob/gyn to refer her [place] - Beta HCG, Quant ([facility])     Patient was given opportunity to ask questions. Patient verbalized understanding of the plan and was able to repeat key elements  of the plan. All questions were answered to their satisfaction.  Arnette Felts, FNP   I, Arnette Felts, FNP, have reviewed all documentation for this visit. The documentation on 10/22/21 for the exam, diagnosis, procedures, and orders are all accurate and complete.   IF YOU HAVE BEEN REFERRED TO A SPECIALIST, IT MAY TAKE 1-2 WEEKS TO SCHEDULE/PROCESS THE REFERRAL. IF YOU HAVE NOT HEARD FROM US/SPECIALIST IN TWO WEEKS, PLEASE GIVE [place] A CALL AT [phone].   THE PATIENT IS ENCOURAGED TO PRACTICE SOCIAL DISTANCING DUE TO THE COVID-19 PANDEMIC.

## 2021-10-23 ENCOUNTER — Encounter: Payer: Self-pay | Admitting: Nurse Practitioner

## 2021-10-23 LAB — BETA HCG QUANT (REF LAB): hCG Quant: 5715 m[IU]/mL

## 2021-10-28 DIAGNOSIS — N390 Urinary tract infection, site not specified: Secondary | ICD-10-CM | POA: Diagnosis not present

## 2021-10-28 DIAGNOSIS — O2340 Unspecified infection of urinary tract in pregnancy, unspecified trimester: Secondary | ICD-10-CM | POA: Diagnosis not present

## 2021-10-28 DIAGNOSIS — Z3A00 Weeks of gestation of pregnancy not specified: Secondary | ICD-10-CM | POA: Diagnosis not present

## 2021-11-05 ENCOUNTER — Other Ambulatory Visit (HOSPITAL_COMMUNITY)
Admission: RE | Admit: 2021-11-05 | Discharge: 2021-11-05 | Disposition: A | Discharge status: Home / Self Care | Payer: 59 | Source: Ambulatory Visit | Attending: Obstetrics and Gynecology | Admitting: Obstetrics and Gynecology

## 2021-11-05 ENCOUNTER — Other Ambulatory Visit: Payer: Self-pay | Admitting: Obstetrics and Gynecology

## 2021-11-05 DIAGNOSIS — Z01419 Encounter for gynecological examination (general) (routine) without abnormal findings: Secondary | ICD-10-CM | POA: Diagnosis not present

## 2021-11-05 DIAGNOSIS — R110 Nausea: Secondary | ICD-10-CM | POA: Diagnosis not present

## 2021-11-05 DIAGNOSIS — N898 Other specified noninflammatory disorders of vagina: Secondary | ICD-10-CM | POA: Diagnosis not present

## 2021-11-08 LAB — CYTOLOGY - PAP: Diagnosis: NEGATIVE

## 2021-12-31 ENCOUNTER — Encounter: Payer: 59 | Admitting: Nurse Practitioner

## 2022-01-15 ENCOUNTER — Other Ambulatory Visit (HOSPITAL_COMMUNITY)
Admission: RE | Admit: 2022-01-15 | Discharge: 2022-01-15 | Disposition: A | Discharge status: Home / Self Care | Payer: 59 | Source: Ambulatory Visit | Attending: Nurse Practitioner | Admitting: Nurse Practitioner

## 2022-01-15 ENCOUNTER — Ambulatory Visit (INDEPENDENT_AMBULATORY_CARE_PROVIDER_SITE_OTHER): Payer: 59 | Admitting: Nurse Practitioner

## 2022-01-15 ENCOUNTER — Encounter: Payer: Self-pay | Admitting: Nurse Practitioner

## 2022-01-15 VITALS — BP 100/60 | HR 70 | Temp 98.4°F | Ht 64.0 in | Wt 112.0 lb

## 2022-01-15 DIAGNOSIS — R079 Chest pain, unspecified: Secondary | ICD-10-CM | POA: Diagnosis not present

## 2022-01-15 DIAGNOSIS — Z30016 Encounter for initial prescription of transdermal patch hormonal contraceptive device: Secondary | ICD-10-CM

## 2022-01-15 DIAGNOSIS — Z113 Encounter for screening for infections with a predominantly sexual mode of transmission: Secondary | ICD-10-CM | POA: Diagnosis not present

## 2022-01-15 LAB — POCT URINE PREGNANCY: Preg Test, Ur: NEGATIVE

## 2022-01-15 MED ORDER — NORELGESTROMIN-ETH ESTRADIOL 150-35 MCG/24HR TD PTWK: 1.0000 | MEDICATED_PATCH | TRANSDERMAL | 12 refills | Status: AC

## 2022-01-15 NOTE — Patient Instructions (Addendum)
Nonspecific Chest Pain, Adult Chest pain is an uncomfortable, tight, or painful feeling in the chest. The pain can feel like a crushing, aching, or squeezing pressure. A person can feel a burning or tingling sensation. Chest pain can also be felt in your back, neck, jaw, shoulder, or arm. This pain can be worse when you move, sneeze, or take a deep breath. Chest pain can be caused by a condition that is life-threatening. This must be treated right away. It can also be caused by something that is not life-threatening. If you have chest pain, it can be hard to know the difference, so it is important to get help right away to make sure that you do not have a serious condition. Some life-threatening causes of chest pain include: Heart attack. A tear in the body's main blood vessel (aortic dissection). Inflammation around your heart (pericarditis). A problem in the lungs, such as a blood clot (pulmonary embolism) or a collapsed lung (pneumothorax). Some non life-threatening causes of chest pain include: Heartburn. Anxiety or stress. Damage to the bones, muscles, and cartilage that make up your chest wall. Pneumonia or bronchitis. Shingles infection (varicella-zoster virus). Your chest pain may come and go. It may also be constant. Your health care provider will do tests and other studies to find the cause of your pain. Treatment will depend on the cause of your chest pain. Follow these instructions at home: Medicines Take over-the-counter and prescription medicines only as told by your health care provider. If you were prescribed an antibiotic medicine, take it as told by your health care provider. Do not stop taking the antibiotic even if you start to feel better. Activity Avoid any activities that cause chest pain. Do not lift anything that is heavier than 10 lb (4.5 kg), or the limit that you are told, until your health care provider says that it is safe. Rest as directed by your health care  provider. Return to your normal activities only as told by your health care provider. Ask your health care provider what activities are safe for you. Lifestyle     Do not use any products that contain nicotine or tobacco, such as cigarettes, e-cigarettes, and chewing tobacco. If you need help quitting, ask your health care provider. Do not drink alcohol. Make healthy lifestyle changes as recommended. These may include: Getting regular exercise. Ask your health care provider to suggest some exercises that are safe for you. Eating a heart-healthy diet. This includes plenty of fresh fruits and vegetables, whole grains, low-fat (lean) protein, and low-fat dairy products. A dietitian can help you find healthy eating options. Maintaining a healthy weight. Managing any other health conditions you may have, such as high blood pressure (hypertension) or diabetes. Reducing stress, such as with yoga or relaxation techniques. General instructions Pay attention to any changes in your symptoms. It is up to you to get the results of any tests that were done. Ask your health care provider, or the department that is doing the tests, when your results will be ready. Keep all follow-up visits as told by your health care provider. This is important. You may be asked to go for further testing if your chest pain does not go away. Contact a health care provider if: Your chest pain does not go away. You feel depressed. You have a fever. You notice changes in your symptoms or develop new symptoms. Get help right away if: Your chest pain gets worse. You have a cough that gets worse, or you   cough up blood. You have severe pain in your abdomen. You faint. You have sudden, unexplained chest discomfort. You have sudden, unexplained discomfort in your arms, back, neck, or jaw. You have shortness of breath at any time. You suddenly start to sweat, or your skin gets clammy. You feel nausea or you vomit. You  suddenly feel lightheaded or dizzy. You have severe weakness, or unexplained weakness or fatigue. Your heart begins to beat quickly, or it feels like it is skipping beats. These symptoms may represent a serious problem that is an emergency. Do not wait to see if the symptoms will go away. Get medical help right away. Call your local emergency services (911 in the [place] Do not drive yourself to the hospital. Summary Chest pain can be caused by a condition that is serious and requires urgent treatment. It may also be caused by something that is not life-threatening. Your health care provider may do lab tests and other studies to find the cause of your pain. Follow your health care provider's instructions on taking medicines, making lifestyle changes, and getting emergency treatment if symptoms become worse. Keep all follow-up visits as told by your health care provider. This includes visits for any further testing if your chest pain does not go away. This information is not intended to replace advice given to you by your health care provider. Make sure you discuss any questions you have with your health care provider. Document Revised: 06/14/2020 Document Reviewed: 06/14/2020 Elsevier Patient Education  [place] will wait until the day your period starts to apply the patch. Then, if you use the first-day start, you'll apply your first patch on the first day of that period. No backup method of contraception is needed

## 2022-01-15 NOTE — Progress Notes (Signed)
I,Tianna Badgett,acting as a Education administrator for [employer], FNP.,have documented all relevant documentation on the behalf of [name], FNP,as directed by  [name], FNP while in the presence of [name], [place].  Subjective:     Patient ID: Beth Warren , female    DOB: 12-29-2000 , 21 y.o.   MRN: [id number]   Chief Complaint  Patient presents with   Chest Pain    HPI  Patient presents today for chest pain and STD retest.  She reports her chest pain has been ongoing since age 18. This occurs intermittently and last for minutes. She is unsure if she has seen a cardiologist. She had a cardiac MRI in 2021 which was normal.   She had abortion with pills in August. She has had a menstrual cycle since that time.   Chest Pain  This is a recurrent problem. The current episode started in the past 7 days. The onset quality is sudden. The problem occurs intermittently. Pain location: left side of chest. The pain is mild. The quality of the pain is described as squeezing. The pain does not radiate. Pertinent negatives include no abdominal pain, fever or numbness. The pain is aggravated by deep breathing. She has tried nothing for the symptoms. Risk factors include alcohol intake, stress and substance abuse.     Past Medical History:  Diagnosis Date   Bacterial vaginosis    H/O gonorrhea      Family History  Problem Relation Age of Onset   GER disease Mother    Asthma Brother      Current Outpatient Medications:    norelgestromin-ethinyl estradiol Marilu Favre) 150-35 MCG/24HR transdermal patch, Place 1 patch onto the skin once a week., Disp: 3 patch, Rfl: 12   No Known Allergies   Review of Systems  Constitutional: Negative.  Negative for fever.  Respiratory: Negative.    Cardiovascular:  Positive for chest pain.  Gastrointestinal: Negative.  Negative for abdominal pain.  Genitourinary:  Positive for vaginal discharge.  Neurological: Negative.  Negative for numbness.     Today's  Vitals   01/15/22 1033  BP: 100/60  Pulse: 70  Temp: 98.4 F (36.9 C)  TempSrc: Oral  Weight: 112 lb (50.8 kg)  Height: 5\' 4"  (1.626 m)   Body mass index is 19.22 kg/m.   Objective:  Physical Exam Vitals reviewed.  Constitutional:      General: She is not in acute distress.    Appearance: Normal appearance. She is well-developed.  HENT:     Head: Normocephalic.  Cardiovascular:     Heart sounds: Normal heart sounds.  Pulmonary:     Effort: Pulmonary effort is normal. No respiratory distress.  Skin:    General: Skin is warm and dry.  Neurological:     General: No focal deficit present.     Mental Status: She is alert and oriented to person, place, and time.     Cranial Nerves: No cranial nerve deficit.  Psychiatric:        Mood and Affect: Mood normal.        Behavior: Behavior normal.        Thought Content: Thought content normal.        Judgment: Judgment normal.         Assessment And Plan:     1. Chest pain, unspecified type Comments: She has been evaluated by Cardiology but continues to have chest pain. EKG is normal, HR 61. Will refer to Pulmonology for further evaluation - EKG  12-Lead - Ambulatory referral to Pulmonology  2. Screening for STD (sexually transmitted disease) - Urine cytology ancillary only  3. Encounter for initial prescription of transdermal patch hormonal contraceptive device Comments: She is advised to start the day after her first day of menstrual cycle. Negative urine pregnancy - norelgestromin-ethinyl estradiol Burr Medico) 150-35 MCG/24HR transdermal patch; Place 1 patch onto the skin once a week.  Dispense: 3 patch; Refill: 12 - POCT Urine Pregnancy     Patient was given opportunity to ask questions. Patient verbalized understanding of the plan and was able to repeat key elements of the plan. All questions were answered to their satisfaction.  Arnette Felts, FNP   I, Arnette Felts, FNP, have reviewed all documentation for this visit.  The documentation on 01/15/22 for the exam, diagnosis, procedures, and orders are all accurate and complete.   IF YOU HAVE BEEN REFERRED TO A SPECIALIST, IT MAY TAKE 1-2 WEEKS TO SCHEDULE/PROCESS THE REFERRAL. IF YOU HAVE NOT HEARD FROM US/SPECIALIST IN TWO WEEKS, PLEASE GIVE [place] A CALL AT [phone].   THE PATIENT IS ENCOURAGED TO PRACTICE SOCIAL DISTANCING DUE TO THE COVID-19 PANDEMIC.

## 2022-01-20 LAB — URINE CYTOLOGY ANCILLARY ONLY
Bacterial Vaginitis-Urine: NEGATIVE
Candida Urine: NEGATIVE
Chlamydia: NEGATIVE
Comment: NEGATIVE
Comment: NEGATIVE
Comment: NORMAL
Neisseria Gonorrhea: NEGATIVE
Trichomonas: NEGATIVE

## 2022-02-08 DIAGNOSIS — S51812A Laceration without foreign body of left forearm, initial encounter: Secondary | ICD-10-CM | POA: Diagnosis not present

## 2022-02-26 ENCOUNTER — Ambulatory Visit (INDEPENDENT_AMBULATORY_CARE_PROVIDER_SITE_OTHER): Payer: 59 | Admitting: Nurse Practitioner

## 2022-02-26 ENCOUNTER — Encounter: Payer: Self-pay | Admitting: Nurse Practitioner

## 2022-02-26 ENCOUNTER — Other Ambulatory Visit (HOSPITAL_COMMUNITY)
Admission: RE | Admit: 2022-02-26 | Discharge: 2022-02-26 | Disposition: A | Discharge status: Home / Self Care | Payer: 59 | Source: Ambulatory Visit | Attending: Nurse Practitioner | Admitting: Nurse Practitioner

## 2022-02-26 VITALS — BP 120/60 | HR 90 | Temp 98.8°F | Ht 64.0 in | Wt 112.4 lb

## 2022-02-26 DIAGNOSIS — Z113 Encounter for screening for infections with a predominantly sexual mode of transmission: Secondary | ICD-10-CM

## 2022-02-26 DIAGNOSIS — Z23 Encounter for immunization: Secondary | ICD-10-CM

## 2022-02-26 DIAGNOSIS — N899 Noninflammatory disorder of vagina, unspecified: Secondary | ICD-10-CM | POA: Diagnosis not present

## 2022-02-26 DIAGNOSIS — Z2821 Immunization not carried out because of patient refusal: Secondary | ICD-10-CM

## 2022-02-26 DIAGNOSIS — N898 Other specified noninflammatory disorders of vagina: Secondary | ICD-10-CM

## 2022-02-26 MED ORDER — HPV 9-VALENT RECOMB VACCINE IM SUSP: 0.5000 mL | Freq: Once | INTRAMUSCULAR | 0 refills | Status: AC

## 2022-02-26 NOTE — Progress Notes (Signed)
Beth Warren,acting as a Neurosurgeon for Beth Felts, FNP.,have documented all relevant documentation on the behalf of Beth Felts, FNP,as directed by  Beth Felts, FNP while in the presence of Beth Felts, FNP.    Subjective:     Patient ID: Beth Warren , female    DOB: [birth year] , 21 y.o.   MRN: [id number]   Chief Complaint  Patient presents with   Exposure to STD    HPI  Patient states she would like STD check, she states she think she has been exposed because she has been having discharge.  Patient states she also would like more information about her birth control, she was not sure when she was supposed to put on the patch. She has continued to be regular with her menstrual cycle.   Exposure to STD  The patient's primary symptoms include a discharge. The current episode started in the past 7 days. The vaginal discharge was thick. Pertinent negatives include no fever or urinary frequency. Risk factors include history of STDs.     Past Medical History:  Diagnosis Date   Bacterial vaginosis    H/O gonorrhea      Family History  Problem Relation Age of Onset   GER disease Mother    Asthma Brother      Current Outpatient Medications:    metroNIDAZOLE (FLAGYL) 500 MG tablet, Take 1 tablet (500 mg total) by mouth 3 (three) times daily for 10 days., Disp: 30 tablet, Rfl: 0   norelgestromin-ethinyl estradiol (XULANE) 150-35 MCG/24HR transdermal patch, Place 1 patch onto the skin once a week., Disp: 3 patch, Rfl: 12   No Known Allergies   Review of Systems  Constitutional: Negative.  Negative for fever.  Respiratory: Negative.    Cardiovascular: Negative.   Genitourinary:  Negative for frequency.  Neurological: Negative.   Psychiatric/Behavioral: Negative.       Today's Vitals   02/26/22 1221  BP: 120/60  Pulse: 90  Temp: 98.8 F (37.1 C)  TempSrc: Oral  Weight: 112 lb 6.4 oz (51 kg)  Height: 5\' 4"  (1.626 m)  PainSc: 0-No pain   Body mass index is  19.29 kg/m.   Objective:  Physical Exam Vitals reviewed.  Constitutional:      General: She is not in acute distress.    Appearance: Normal appearance.  Pulmonary:     Effort: Pulmonary effort is normal. No respiratory distress.  Genitourinary:    General: Normal vulva.     Vagina: Normal. No vaginal discharge (none present on exam) or tenderness.  Skin:    Capillary Refill: Capillary refill takes less than 2 seconds.  Neurological:     General: No focal deficit present.     Mental Status: She is alert and oriented to person, place, and time.     Cranial Nerves: No cranial nerve deficit.  Psychiatric:        Mood and Affect: Mood normal.        Behavior: Behavior normal.        Thought Content: Thought content normal.        Judgment: Judgment normal.         Assessment And Plan:     1. Vaginal discharge Comments: Will check for STDs, will treat pending labs. - Cervicovaginal ancillary only  2. Screening for STD (sexually transmitted disease)  3. Influenza vaccination declined Patient declined influenza vaccination at this time. Patient is aware that influenza vaccine prevents illness in 70% of healthy people, and reduces hospitalizations  to 30-70% in elderly. This vaccine is recommended annually. Education has been provided regarding the importance of this vaccine but patient still declined. Advised may receive this vaccine at local pharmacy or Health Dept.or vaccine clinic. Aware to provide a copy of the vaccination record if obtained from local pharmacy or Health Dept.  Pt is willing to accept risk associated with refusing vaccination.  4. Encounter for administration of vaccine Comments: Rx sent to pharmacy for HPV - hpv 9-valent vaccine (GARDASIL 9) SUSP injection; Inject 0.5 mLs into the muscle once for 1 dose.  Dispense: 0.5 mL; Refill: 0     Patient was given opportunity to ask questions. Patient verbalized understanding of the plan and was able to repeat key  elements of the plan. All questions were answered to their satisfaction.  Beth Felts, FNP   I, Beth Felts, FNP, have reviewed all documentation for this visit. The documentation on 02/26/22 for the exam, diagnosis, procedures, and orders are all accurate and complete.   IF YOU HAVE BEEN REFERRED TO A SPECIALIST, IT MAY TAKE 1-2 WEEKS TO SCHEDULE/PROCESS THE REFERRAL. IF YOU HAVE NOT HEARD FROM US/SPECIALIST IN TWO WEEKS, PLEASE GIVE [place] A CALL AT [phone].   THE PATIENT IS ENCOURAGED TO PRACTICE SOCIAL DISTANCING DUE TO THE COVID-19 PANDEMIC.

## 2022-02-26 NOTE — Patient Instructions (Addendum)
Preventing Sexually Transmitted Infections, Adult Sexually transmitted infections (STIs) are spread from person to person (are contagious). They are spread, or transmitted, during sex. The sex may be vaginal, anal, or oral. STIs can be passed during sexual contact with skin, genitals, mouth, or rectum. They may spread through body fluids, such as saliva, semen, blood, vaginal mucus, and urine. STIs are very common. They can happen in people of all ages. Some common STIs are: Herpes. Hepatitis B. Chlamydia. Gonorrhea. Syphilis. Trichomoniasis. Human papillomavirus (HPV). Human immunodeficiency virus (HIV). This can cause acquired immunodeficiency syndrome (AIDS). How can STIs affect me? You may not have symptoms with an STI. Even if you do not have symptoms, you can still spread the infection to others. You also still need treatment. STIs can be treated. Some STIs can be cured. Other STIs cannot be cured and will affect you for the rest of your life. Certain STIs may: Require you to take medicine for the rest of your life. Affect your ability to have children. Increase your risk for getting other STIs. Increase your risk of getting certain conditions. These may include: Cervical cancer. Pelvic inflammatory disease (PID). Organ damage or damage to other parts of your body. This can happen if the infection spreads. Cause problems during pregnancy. STIs may be spread to the baby during pregnancy or birth. Females tend to have more severe problems from STIs than males. What can increase my risk? You may be more at risk for an STI if: You do not use protection during sex. You have more than one sex partner. You have a sex partner who has other sex partners. You have sex with a person who has an STI. You have an STI, or you have had an STI before. You inject drugs or have a sex partner who injects drugs. What actions can I take to prevent STIs? The only way to fully prevent STIs is not to  have sex of any kind. This is called practicing abstinence. If you are sexually active, you can protect yourself and others by taking these actions to lower your risk of getting an STI: Lifestyle Have only one sex partner or limit the number of sex partners you have. Avoid having sex after you have alcohol or drugs. Alcohol and drugs can affect your ability to make good choices. This can lead to risky sexual behaviors. Go to prevention counseling. This can teach you how to avoid getting an STI. Barrier protection  Use methods to stop body fluids from being exchanged between partners during sex (barrier protection). These methods can be used during oral, vaginal, or anal sex. They include: External condom, for males. Internal condom, for females. Dental dam. Use a new barrier method for every sex act from start to finish. Know that a barrier method may not protect you from all STIs. Some STIs, such as herpes, are spread through skin-to-skin contact. Avoid all sexual contact if you or a partner has herpes and there is an active flare with open sores. Birth control pills, injections, implants, and intrauterine devices (IUDs) do not protect against STIs. To prevent both STIs and pregnancy, always use a condom with a second form of birth control. General information Ask your health care provider about taking pre-exposure prophylaxis (PrEP) to prevent HIV. Stay up to date on your vaccines. Some vaccines can lower your risk of getting certain STIs. These include: Hepatitis B vaccine. HPV vaccine. This is recommended for people up to age 26. Get tested for STIs. Have your   partners get tested, too. If you test positive for an STI, follow recommendations from your health care provider about treatment. Make sure your sex partners are tested and treated as well. Where to find more information Learn more about STIs from: Centers for Disease Control and Prevention (CDC): More information about certain  STIs: TonerPromos.no Places to get sexual health counseling and treatment for free or at a low cost: gettested.TonerPromos.no [place]. Department of Health and Human Services [facility]): TravelLesson.ca This information is not intended to replace advice given to you by your health care provider. Make sure you discuss any questions you have with your health care provider. Document Revised: 11/12/2021 Document Reviewed: 09/13/2021 Elsevier Patient Education  2023 [employer].   Start your birth control patch within 5 days after the start of your menstrual cycle. You can wear the patch on your abdomen, upper torso (excluding the breast), on your upper arm, lower back, or on your buttocks. A different site can be selected each week if you choose, but wherever you choose, the patch must remain there for 7 days.The 4th week no patch. So a total of 21 days you will wear your patch

## 2022-02-28 LAB — CERVICOVAGINAL ANCILLARY ONLY
Bacterial Vaginitis (gardnerella): POSITIVE — AB
Candida Glabrata: NEGATIVE
Candida Vaginitis: NEGATIVE
Chlamydia: NEGATIVE
Comment: NEGATIVE
Comment: NEGATIVE
Comment: NEGATIVE
Comment: NEGATIVE
Comment: NEGATIVE
Comment: NORMAL
Neisseria Gonorrhea: NEGATIVE
Trichomonas: POSITIVE — AB

## 2022-03-03 ENCOUNTER — Other Ambulatory Visit: Payer: Self-pay | Admitting: Nurse Practitioner

## 2022-03-03 ENCOUNTER — Encounter: Payer: Self-pay | Admitting: Nurse Practitioner

## 2022-03-03 DIAGNOSIS — Z23 Encounter for immunization: Secondary | ICD-10-CM

## 2022-03-03 MED ORDER — HPV 9-VALENT RECOMB VACCINE IM SUSP: 0.5000 mL | Freq: Once | INTRAMUSCULAR | 0 refills | Status: AC

## 2022-03-11 ENCOUNTER — Other Ambulatory Visit: Payer: Self-pay | Admitting: Nurse Practitioner

## 2022-03-11 DIAGNOSIS — A599 Trichomoniasis, unspecified: Secondary | ICD-10-CM

## 2022-03-11 DIAGNOSIS — B9689 Other specified bacterial agents as the cause of diseases classified elsewhere: Secondary | ICD-10-CM

## 2022-03-11 MED ORDER — METRONIDAZOLE 500 MG PO TABS: 500.0000 mg | ORAL_TABLET | Freq: Three times a day (TID) | ORAL | 0 refills | Status: AC

## 2022-03-15 DIAGNOSIS — M67432 Ganglion, left wrist: Secondary | ICD-10-CM | POA: Diagnosis not present

## 2022-03-15 DIAGNOSIS — M25532 Pain in left wrist: Secondary | ICD-10-CM | POA: Diagnosis not present

## 2022-03-19 ENCOUNTER — Telehealth: Payer: Self-pay

## 2022-03-19 NOTE — Telephone Encounter (Signed)
Transition Care Management Unsuccessful Follow-up Telephone Call  Date of discharge and from where:  03/15/2022 [facility]   Attempts:  1st Attempt  Reason for unsuccessful TCM follow-up call:  Left voice message    

## 2022-03-21 ENCOUNTER — Telehealth: Payer: Self-pay

## 2022-03-21 NOTE — Telephone Encounter (Signed)
Transition Care Management Unsuccessful Follow-up Telephone Call  Date of discharge and from where:  03/15/2022 [facility]   Attempts:  1st Attempt  Reason for unsuccessful TCM follow-up call:  Left voice message

## 2022-03-31 ENCOUNTER — Encounter: Payer: Self-pay | Admitting: Physician Assistant

## 2022-03-31 ENCOUNTER — Ambulatory Visit (INDEPENDENT_AMBULATORY_CARE_PROVIDER_SITE_OTHER): Payer: 59 | Admitting: Physician Assistant

## 2022-03-31 DIAGNOSIS — M67432 Ganglion, left wrist: Secondary | ICD-10-CM

## 2022-03-31 NOTE — Progress Notes (Signed)
Office Visit Note   Patient: Beth Warren           Date of Birth: 08-Jun-2000           MRN: [id number] Visit Date: 03/31/2022              Requested by: Arnette Felts, FNP [address] [place],  [place] [address] PCP: Arnette Felts, FNP  Chief Complaint  Patient presents with   Left Wrist - Pain    ganglion      HPI: Patient is a pleasant 21 year old woman with a 2-week history of a left dorsal ganglion cyst of her wrist.  She is left-hand dominant.  She denies any injury except for right before that she did sustain a laceration to her volar surface of her forearm.  She is not sure if this was related.  The cyst was somewhat painful and is getting better.  She was advised that given the wrist splint in the emergency room she said she just stopped using that yesterday.  Assessment & Plan: Visit Diagnoses: Ganglion cyst left wrist.  Plan: Exam findings with a lobulated ganglion cyst of the dorsal left wrist.  She really does not have any pain with range of motion just slightly tender over the cyst which are both very small.  I advised that she go back into the splint and use Voltaren topical for the next couple weeks we will reevaluate her at that time.  Follow-Up Instructions: Return in about 2 weeks (around 04/14/2022).   Ortho Exam  Patient is alert, oriented, no adenopathy, well-dressed, normal affect, normal respiratory effort. Examination of her left wrist she has a strong radial pulse she has brisk capillary refill no swelling no erythema no cellulitis.  She has 2 very small palpable cyst over the dorsum of her hand.  They do not seem to be too tender to palpation today.  Imaging: No results found. No images are attached to the encounter.  Labs: No results found for: "HGBA1C", "ESRSEDRATE", "CRP", "LABURIC", "REPTSTATUS", "GRAMSTAIN", "CULT", "LABORGA"   Lab Results  Component Value Date   ALBUMIN 3.7 08/29/2020   ALBUMIN 4.4 12/22/2019    No results  found for: "MG" No results found for: "VD25OH"  No results found for: "PREALBUMIN"    Latest Ref Rng & Units 12/22/2019    4:58 PM 08/24/2018   11:43 AM  CBC EXTENDED  WBC 3.4 - 10.8 x10E3/uL 5.8    RBC 3.77 - 5.28 x10E6/uL 3.55    Hemoglobin 11.1 - 15.9 g/dL 93.8  10.1   HCT 75.1 - 46.6 % 36.4    Platelets 150 - 450 x10E3/uL 174       There is no height or weight on file to calculate BMI.  Orders:  No orders of the defined types were placed in this encounter.  No orders of the defined types were placed in this encounter.    Procedures: No procedures performed  Clinical Data: No additional findings.  ROS:  All other systems negative, except as noted in the HPI. Review of Systems  Objective: Vital Signs: There were no vitals taken for this visit.  Specialty Comments:  No specialty comments available.  PMFS History: Patient Active Problem List   Diagnosis Date Noted   Positive pregnancy test 10/22/2021   Dyspepsia 05/27/2018   Teenage mother 07/16/2015   Past Medical History:  Diagnosis Date   Bacterial vaginosis    H/O gonorrhea     Family History  Problem  Relation Age of Onset   GER disease Mother    Asthma Brother     Past Surgical History:  Procedure Laterality Date   TONSILLECTOMY     WISDOM TOOTH EXTRACTION  2018   Social History   Occupational History   Not on file  Tobacco Use   Smoking status: Never   Smokeless tobacco: Never  Substance and Sexual Activity   Alcohol use: Not on file   Drug use: Not on file   Sexual activity: Not on file

## 2022-04-15 ENCOUNTER — Ambulatory Visit: Payer: Commercial Managed Care - PPO | Admitting: Physician Assistant

## 2022-05-04 DIAGNOSIS — R112 Nausea with vomiting, unspecified: Secondary | ICD-10-CM | POA: Diagnosis not present

## 2022-05-29 ENCOUNTER — Ambulatory Visit (INDEPENDENT_AMBULATORY_CARE_PROVIDER_SITE_OTHER): Payer: Commercial Managed Care - PPO | Admitting: Physician Assistant

## 2022-05-29 ENCOUNTER — Encounter: Payer: Self-pay | Admitting: Physician Assistant

## 2022-05-29 DIAGNOSIS — M67432 Ganglion, left wrist: Secondary | ICD-10-CM | POA: Diagnosis not present

## 2022-05-29 NOTE — Progress Notes (Signed)
Office Visit Note   Patient: Beth Warren           Date of Birth: [birth year]           MRN: [id number] Visit Date: 05/29/2022              Requested by: [name], [address] [place],  [place] [address] PCP: [name], FNP  Chief Complaint  Patient presents with   Left Hand - Pain      HPI: Beth Warren is a pleasant 22 year old woman who I saw in December for a painful ganglion cyst of her left wrist.  She was to try some Voltaren gel and to follow-up if she still had difficulties.  She says she continues to have problems with the wrist which comes and goes over the dorsum of her hand and wrist.  When it is there it is quite painful especially with extension of her wrist.  She does not think the Voltaren gel is helping very much.  It is not very painful but it today but has been in the past  Assessment & Plan: Visit Diagnoses:  1. Ganglion cyst of dorsum of left wrist     Plan: Her exam is fairly normal today.  I cannot palpate the cyst.  Cannot reproduce the pain with extension and flexion.  She her sensation is intact  I offered her an MRI if she is concerned she would rather not do this.  Offer her physical therapy with her hand therapist which she declined.  I discussed with her the possibility of having Dr. Rolena Infante evaluate this by ultrasound and she is willing to do this.  Will place a referral  Follow-Up Instructions: Return With Dr. Rolena Infante.   Ortho Exam  Patient is alert, oriented, no adenopathy, well-dressed, normal affect, normal respiratory effort. Left wrist she is neurovascular intact there is no swelling no erythema she has good active flexion extension supination pronation without pain.  Cannot palpate the cystic structure today though what has been present in the past  Imaging: No results found. No images are attached to the encounter.  Labs: No results found for: "HGBA1C", "ESRSEDRATE", "CRP", "LABURIC", "REPTSTATUS", "GRAMSTAIN",  "CULT", "LABORGA"   Lab Results  Component Value Date   ALBUMIN 3.7 08/29/2020   ALBUMIN 4.4 12/22/2019    No results found for: "MG" No results found for: "VD25OH"  No results found for: "PREALBUMIN"    Latest Ref Rng & Units 12/22/2019    4:58 PM 08/24/2018   11:43 AM  CBC EXTENDED  WBC 3.4 - 10.8 x10E3/uL 5.8    RBC 3.77 - 5.28 x10E6/uL 3.55    Hemoglobin 11.1 - 15.9 g/dL 12.2  13.4   HCT 34.0 - 46.6 % 36.4    Platelets 150 - 450 x10E3/uL 174       There is no height or weight on file to calculate BMI.  Orders:  Orders Placed This Encounter  Procedures   Ambulatory referral to Orthopedic Surgery   No orders of the defined types were placed in this encounter.    Procedures: No procedures performed  Clinical Data: No additional findings.  ROS:  All other systems negative, except as noted in the HPI. Review of Systems  Objective: Vital Signs: There were no vitals taken for this visit.  Specialty Comments:  No specialty comments available.  PMFS History: Patient Active Problem List   Diagnosis Date Noted   Positive pregnancy test 10/22/2021   Dyspepsia 05/27/2018  Teenage mother 07/16/2015   Past Medical History:  Diagnosis Date   Bacterial vaginosis    H/O gonorrhea     Family History  Problem Relation Age of Onset   GER disease Mother    Asthma Brother     Past Surgical History:  Procedure Laterality Date   TONSILLECTOMY     WISDOM TOOTH EXTRACTION  2018   Social History   Occupational History   Not on file  Tobacco Use   Smoking status: Never   Smokeless tobacco: Never  Substance and Sexual Activity   Alcohol use: Not on file   Drug use: Not on file   Sexual activity: Not on file

## 2022-06-03 ENCOUNTER — Ambulatory Visit: Payer: Commercial Managed Care - PPO | Admitting: Sports Medicine

## 2022-06-24 ENCOUNTER — Encounter: Payer: Self-pay | Admitting: Nurse Practitioner

## 2022-06-24 ENCOUNTER — Other Ambulatory Visit (HOSPITAL_COMMUNITY)
Admission: RE | Admit: 2022-06-24 | Discharge: 2022-06-24 | Disposition: A | Discharge status: Home / Self Care | Payer: Commercial Managed Care - PPO | Source: Ambulatory Visit | Attending: Nurse Practitioner | Admitting: Nurse Practitioner

## 2022-06-24 ENCOUNTER — Ambulatory Visit (INDEPENDENT_AMBULATORY_CARE_PROVIDER_SITE_OTHER): Payer: Commercial Managed Care - PPO | Admitting: Nurse Practitioner

## 2022-06-24 VITALS — BP 98/58 | Temp 98.4°F | Ht 64.0 in | Wt 110.0 lb

## 2022-06-24 DIAGNOSIS — R630 Anorexia: Secondary | ICD-10-CM

## 2022-06-24 DIAGNOSIS — J302 Other seasonal allergic rhinitis: Secondary | ICD-10-CM

## 2022-06-24 DIAGNOSIS — Z114 Encounter for screening for human immunodeficiency virus [HIV]: Secondary | ICD-10-CM

## 2022-06-24 DIAGNOSIS — Z113 Encounter for screening for infections with a predominantly sexual mode of transmission: Secondary | ICD-10-CM

## 2022-06-24 DIAGNOSIS — I428 Other cardiomyopathies: Secondary | ICD-10-CM

## 2022-06-24 DIAGNOSIS — F1290 Cannabis use, unspecified, uncomplicated: Secondary | ICD-10-CM | POA: Diagnosis not present

## 2022-06-24 MED ORDER — CETIRIZINE HCL 10 MG PO TABS: 10.0000 mg | ORAL_TABLET | Freq: Every day | ORAL | 2 refills | Status: DC

## 2022-06-24 NOTE — Progress Notes (Signed)
I,Beth Warren,acting as a Education administrator for Beth Brine, FNP.,have documented all relevant documentation on the behalf of Beth Brine, FNP,as directed by  Beth Brine, FNP while in the presence of Beth Warren, Beth Warren.    Subjective:     Patient ID: Beth Warren , female    DOB: 10/11/2000 , 22 y.o.   MRN: [id number]   Chief Complaint  Patient presents with   Loss of Appetite    HPI  Patient presents today for concerns about her appetite, states she goes back and forth between being hungry and not wanting to eat, which can cause her to have headaches. This has been going on for several months. She states she does drink maybe a bottle of water daily, mostly juices and other drinks. She denies nausea/emesis/diarrhea/constipation. She does eat out a lot, she does not like to eat fast food a lot anymore with fast food.     Patient also states she wants a "check-up".   Yesterday no breakfast, she ate dinner with sandwich with fries, wings. She drinks alcohol as well. Will drink every other weekend.   Wt Readings from Last 3 Encounters: 06/24/22 : 110 lb (49.9 kg) 02/26/22 : 112 lb 6.4 oz (51 kg) 01/15/22 : 112 lb (50.8 kg)       Past Medical History:  Diagnosis Date   Bacterial vaginosis    H/O gonorrhea      Family History  Problem Relation Age of Onset   GER disease Mother    Asthma Brother      Current Outpatient Medications:    cetirizine (ZYRTEC ALLERGY) 10 MG tablet, Take 1 tablet (10 mg total) by mouth daily., Disp: 30 tablet, Rfl: 2   norelgestromin-ethinyl estradiol Marilu Favre) 150-35 MCG/24HR transdermal patch, Place 1 patch onto the skin once a week. (Patient not taking: Reported on 06/24/2022), Disp: 3 patch, Rfl: 12   No Known Allergies   Review of Systems  Constitutional: Negative.   Respiratory: Negative.    Cardiovascular: Negative.   Neurological: Negative.   Psychiatric/Behavioral: Negative.       Today's Vitals   06/24/22 1614  BP: (!) 98/58   Temp: 98.4 F (36.9 C)  TempSrc: Oral  Weight: 110 lb (49.9 kg)  Height: '5\' 4"'$  (1.626 m)   Body mass index is 18.88 kg/m.   Objective:  Physical Exam Vitals reviewed.  Constitutional:      General: She is not in acute distress.    Appearance: Normal appearance. She is well-developed.  HENT:     Head: Normocephalic.  Cardiovascular:     Pulses: Normal pulses.     Heart sounds: Normal heart sounds. No murmur heard. Pulmonary:     Effort: Pulmonary effort is normal. No respiratory distress.     Breath sounds: No wheezing.  Skin:    General: Skin is warm and dry.  Neurological:     General: No focal deficit present.     Mental Status: She is alert and oriented to person, place, and time.     Cranial Nerves: No cranial nerve deficit.  Psychiatric:        Mood and Affect: Mood normal.        Behavior: Behavior normal.        Thought Content: Thought content normal.        Judgment: Judgment normal.         Assessment And Plan:     1. Decreased appetite - Hemoglobin A1c - CBC - CMP14+EGFR - TSH  2. Seasonal allergies - cetirizine (ZYRTEC ALLERGY) 10 MG tablet; Take 1 tablet (10 mg total) by mouth daily.  Dispense: 30 tablet; Refill: 2  3. 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 Patient verbalized understanding of the plan and was able to repeat key elements of the plan. All questions were answered to their satisfaction.  Beth Brine, FNP   I, Beth Brine, FNP, have reviewed all documentation for this visit. The documentation on 06/24/22 for the exam, diagnosis, procedures, and orders are all accurate and complete.   IF YOU HAVE BEEN REFERRED TO A SPECIALIST, IT MAY TAKE 1-2 WEEKS TO SCHEDULE/PROCESS THE REFERRAL. IF YOU HAVE NOT HEARD FROM US/SPECIALIST IN TWO WEEKS, PLEASE GIVE [place] A CALL AT [phone] X [phone].   THE PATIENT IS ENCOURAGED TO PRACTICE SOCIAL DISTANCING DUE TO THE COVID-19  PANDEMIC.

## 2022-06-25 LAB — CBC
Hematocrit: 37.9 % (ref 34.0–46.6)
Hemoglobin: 13 g/dL (ref 11.1–15.9)
MCH: 34.9 pg — ABNORMAL HIGH (ref 26.6–33.0)
MCHC: 34.3 g/dL (ref 31.5–35.7)
MCV: 102 fL — ABNORMAL HIGH (ref 79–97)
Platelets: 213 10*3/uL (ref 150–450)
RBC: 3.73 x10E6/uL — ABNORMAL LOW (ref 3.77–5.28)
RDW: 11.3 % — ABNORMAL LOW (ref 11.7–15.4)
WBC: 5.7 10*3/uL (ref 3.4–10.8)

## 2022-06-25 LAB — CMP14+EGFR
ALT: 14 IU/L (ref 0–32)
AST: 15 IU/L (ref 0–40)
Albumin/Globulin Ratio: 2 (ref 1.2–2.2)
Albumin: 4.9 g/dL (ref 4.0–5.0)
Alkaline Phosphatase: 52 IU/L (ref 44–121)
BUN/Creatinine Ratio: 13 (ref 9–23)
BUN: 10 mg/dL (ref 6–20)
Bilirubin Total: 0.6 mg/dL (ref 0.0–1.2)
CO2: 24 mmol/L (ref 20–29)
Calcium: 9.7 mg/dL (ref 8.7–10.2)
Chloride: 104 mmol/L (ref 96–106)
Creatinine, Ser: 0.76 mg/dL (ref 0.57–1.00)
Globulin, Total: 2.5 g/dL (ref 1.5–4.5)
Glucose: 78 mg/dL (ref 70–99)
Potassium: 4 mmol/L (ref 3.5–5.2)
Sodium: 142 mmol/L (ref 134–144)
Total Protein: 7.4 g/dL (ref 6.0–8.5)
eGFR: 113 mL/min/{1.73_m2} (ref >59)

## 2022-06-25 LAB — TSH: TSH: 0.451 u[IU]/mL (ref 0.450–4.500)

## 2022-06-25 LAB — HEMOGLOBIN A1C
Est. average glucose Bld gHb Est-mCnc: 91 mg/dL
Hgb A1c MFr Bld: 4.8 % (ref 4.8–5.6)

## 2022-06-25 LAB — HSV 1 AND 2 AB, IGG
HSV 1 Glycoprotein G Ab, IgG: <0.91 index (ref 0.00–0.90)
HSV 2 IgG, Type Spec: 9.25 index — ABNORMAL HIGH (ref 0.00–0.90)

## 2022-06-25 LAB — RPR: RPR Ser Ql: NONREACTIVE

## 2022-06-25 LAB — HIV ANTIBODY (ROUTINE TESTING W REFLEX): HIV Screen 4th Generation wRfx: NONREACTIVE

## 2022-06-26 ENCOUNTER — Other Ambulatory Visit: Payer: Self-pay | Admitting: Nurse Practitioner

## 2022-06-26 DIAGNOSIS — N760 Acute vaginitis: Secondary | ICD-10-CM

## 2022-06-26 DIAGNOSIS — B009 Herpesviral infection, unspecified: Secondary | ICD-10-CM

## 2022-06-26 DIAGNOSIS — B3749 Other urogenital candidiasis: Secondary | ICD-10-CM

## 2022-06-26 LAB — URINE CYTOLOGY ANCILLARY ONLY
Bacterial Vaginitis-Urine: POSITIVE — AB
Candida Urine: POSITIVE — AB
Chlamydia: NEGATIVE
Comment: NEGATIVE
Comment: NEGATIVE
Comment: NORMAL
Neisseria Gonorrhea: NEGATIVE
Trichomonas: NEGATIVE

## 2022-06-26 MED ORDER — VALACYCLOVIR HCL 500 MG PO TABS: 500.0000 mg | ORAL_TABLET | Freq: Every day | ORAL | 0 refills | Status: AC

## 2022-06-26 MED ORDER — FLUCONAZOLE 100 MG PO TABS: 100.0000 mg | ORAL_TABLET | Freq: Every day | ORAL | 0 refills | Status: DC

## 2022-06-26 MED ORDER — METRONIDAZOLE 500 MG PO TABS: 500.0000 mg | ORAL_TABLET | Freq: Three times a day (TID) | ORAL | 0 refills | Status: AC

## 2022-06-27 ENCOUNTER — Other Ambulatory Visit: Payer: Self-pay | Admitting: Nurse Practitioner

## 2022-06-27 ENCOUNTER — Other Ambulatory Visit: Payer: Self-pay

## 2022-06-27 DIAGNOSIS — D649 Anemia, unspecified: Secondary | ICD-10-CM | POA: Diagnosis not present

## 2022-06-27 NOTE — Addendum Note (Signed)
Addended by: [name] F on: 06/27/2022 09:31 AM   Modules accepted: Orders

## 2022-06-28 LAB — IRON,TIBC AND FERRITIN PANEL
Ferritin: 50 ng/mL (ref 15–150)
Iron Saturation: 44 % (ref 15–55)
Iron: 163 ug/dL — ABNORMAL HIGH (ref 27–159)
Total Iron Binding Capacity: 372 ug/dL (ref 250–450)
UIBC: 209 ug/dL (ref 131–425)

## 2022-07-23 ENCOUNTER — Encounter: Payer: 59 | Admitting: Nurse Practitioner

## 2022-08-01 ENCOUNTER — Encounter: Payer: Self-pay | Admitting: Nurse Practitioner

## 2022-11-06 ENCOUNTER — Encounter: Payer: Self-pay | Admitting: Nurse Practitioner

## 2022-11-06 ENCOUNTER — Ambulatory Visit (INDEPENDENT_AMBULATORY_CARE_PROVIDER_SITE_OTHER): Payer: Commercial Managed Care - PPO | Admitting: Nurse Practitioner

## 2022-11-06 VITALS — BP 110/60 | HR 85 | Temp 99.3°F | Ht 64.0 in | Wt 108.8 lb

## 2022-11-06 DIAGNOSIS — W448XXA Other foreign body entering into or through a natural orifice, initial encounter: Secondary | ICD-10-CM

## 2022-11-06 DIAGNOSIS — Z789 Other specified health status: Secondary | ICD-10-CM | POA: Diagnosis not present

## 2022-11-06 DIAGNOSIS — B009 Herpesviral infection, unspecified: Secondary | ICD-10-CM | POA: Diagnosis not present

## 2022-11-06 DIAGNOSIS — Z113 Encounter for screening for infections with a predominantly sexual mode of transmission: Secondary | ICD-10-CM

## 2022-11-06 DIAGNOSIS — Z9189 Other specified personal risk factors, not elsewhere classified: Secondary | ICD-10-CM

## 2022-11-06 DIAGNOSIS — T192XXA Foreign body in vulva and vagina, initial encounter: Secondary | ICD-10-CM | POA: Diagnosis not present

## 2022-11-06 DIAGNOSIS — Z0000 Encounter for general adult medical examination without abnormal findings: Secondary | ICD-10-CM | POA: Diagnosis not present

## 2022-11-06 DIAGNOSIS — N926 Irregular menstruation, unspecified: Secondary | ICD-10-CM

## 2022-11-06 DIAGNOSIS — F1290 Cannabis use, unspecified, uncomplicated: Secondary | ICD-10-CM

## 2022-11-06 MED ORDER — CEPHALEXIN 500 MG PO CAPS
500.0000 mg | ORAL_CAPSULE | Freq: Four times a day (QID) | ORAL | 0 refills | Status: AC
Start: 2022-11-06 — End: 2022-11-16
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
## 2022-11-06 NOTE — Assessment & Plan Note (Addendum)
Removed a retained tampon while patient in lithotomy position.  Also treated with antibiotics to prevent risk for infection.  She is unaware of how long the tampon may have been in started in the vagina has been in for at least a week

## 2022-11-06 NOTE — Assessment & Plan Note (Signed)
Menstrual cycle last month was shorter than usual.  He was seen to have his next couple of months menstrual cycles.

## 2022-11-06 NOTE — Assessment & Plan Note (Signed)
Behavior modifications discussed and diet history reviewed.   Pt will continue to exercise regularly and modify diet with low GI, plant based foods and decrease intake of processed foods.  Recommend intake of daily multivitamin, Vitamin D, and calcium.  Recommend self breast exams monthly after her menstrual cycle # of for preventive screenings, as well as recommend immunizations that include influenza, TDAP

## 2022-11-13 ENCOUNTER — Other Ambulatory Visit: Payer: Self-pay | Admitting: Nurse Practitioner

## 2022-11-13 MED ORDER — METRONIDAZOLE 500 MG PO TABS: 500.0000 mg | ORAL_TABLET | Freq: Three times a day (TID) | ORAL | 0 refills | Status: DC

## 2022-11-18 ENCOUNTER — Other Ambulatory Visit: Payer: Self-pay

## 2022-11-18 ENCOUNTER — Other Ambulatory Visit: Payer: Self-pay | Admitting: Nurse Practitioner

## 2022-11-18 ENCOUNTER — Encounter: Payer: Self-pay | Admitting: Nurse Practitioner

## 2022-11-18 DIAGNOSIS — T192XXA Foreign body in vulva and vagina, initial encounter: Secondary | ICD-10-CM

## 2022-11-18 DIAGNOSIS — B379 Candidiasis, unspecified: Secondary | ICD-10-CM

## 2022-11-18 DIAGNOSIS — N760 Acute vaginitis: Secondary | ICD-10-CM

## 2022-11-18 DIAGNOSIS — B3749 Other urogenital candidiasis: Secondary | ICD-10-CM

## 2022-11-18 MED ORDER — METRONIDAZOLE 500 MG PO TABS
500.0000 mg | ORAL_TABLET | Freq: Three times a day (TID) | ORAL | 0 refills | Status: AC
Start: 2022-11-18 — End: 2022-11-28

## 2022-11-18 MED ORDER — CEPHALEXIN 500 MG PO CAPS
500.0000 mg | ORAL_CAPSULE | Freq: Four times a day (QID) | ORAL | 0 refills | Status: AC
Start: 2022-11-18 — End: 2022-11-28

## 2022-11-25 ENCOUNTER — Ambulatory Visit: Payer: Commercial Managed Care - PPO | Admitting: Nurse Practitioner

## 2022-12-25 ENCOUNTER — Ambulatory Visit: Payer: Commercial Managed Care - PPO | Admitting: Nurse Practitioner

## 2023-01-21 ENCOUNTER — Ambulatory Visit: Payer: Commercial Managed Care - PPO | Admitting: Nurse Practitioner

## 2023-01-21 NOTE — Progress Notes (Deleted)
Madelaine Bhat, CMA,acting as a Neurosurgeon for Arnette Felts, FNP.,have documented all relevant documentation on the behalf of Arnette Felts, FNP,as directed by  Arnette Felts, FNP while in the presence of Arnette Felts, FNP.  Subjective:  Patient ID: Beth Warren , female    DOB: July 07, 2000 , 22 y.o.   MRN: [id number]  No chief complaint on file.   HPI  Patient presents today for STD testing,  Patient reports compliance with medication. Patient denies any chest pain, SOB, or headaches. Patient has no concerns today.     Past Medical History:  Diagnosis Date  . Bacterial vaginosis   . H/O gonorrhea      Family History  Problem Relation Age of Onset  . GER disease Mother   . Asthma Brother      Current Outpatient Medications:  .  cetirizine (ZYRTEC ALLERGY) 10 MG tablet, Take 1 tablet (10 mg total) by mouth daily., Disp: 30 tablet, Rfl: 2 .  fluconazole (DIFLUCAN) 100 MG tablet, Take 1 tablet (100 mg total) by mouth daily. Take 1 tablet by mouth now repeat in 5 days, Disp: 14 tablet, Rfl: 0 .  norelgestromin-ethinyl estradiol Burr Medico) 150-35 MCG/24HR transdermal patch, Place 1 patch onto the skin once a week. (Patient not taking: Reported on 06/24/2022), Disp: 3 patch, Rfl: 12   No Known Allergies   Review of Systems  Constitutional: Negative.   HENT: Negative.    Eyes: Negative.   Respiratory: Negative.    Cardiovascular: Negative.   Gastrointestinal: Negative.     There were no vitals filed for this visit. There is no height or weight on file to calculate BMI.  Wt Readings from Last 3 Encounters:  11/06/22 108 lb 12.8 oz (49.4 kg)  06/24/22 110 lb (49.9 kg)  02/26/22 112 lb 6.4 oz (51 kg)    The ASCVD Risk score (Arnett DK, et al., 2019) failed to calculate for the following reasons:   The 2019 ASCVD risk score is only valid for ages 75 to 54  Objective:  Physical Exam      Assessment And Plan:  Screening for STDs (sexually transmitted diseases)    No follow-ups  on file.  Patient was given opportunity to ask questions. Patient verbalized understanding of the plan and was able to repeat key elements of the plan. All questions were answered to their satisfaction.    Jeanell Sparrow, FNP, have reviewed all documentation for this visit. The documentation on 01/21/23 for the exam, diagnosis, procedures, and orders are all accurate and complete.   IF YOU HAVE BEEN REFERRED TO A SPECIALIST, IT MAY TAKE 1-2 WEEKS TO SCHEDULE/PROCESS THE REFERRAL. IF YOU HAVE NOT HEARD FROM US/SPECIALIST IN TWO WEEKS, PLEASE GIVE [place] A CALL AT [phone] X [phone].

## 2023-06-12 DIAGNOSIS — J029 Acute pharyngitis, unspecified: Secondary | ICD-10-CM | POA: Diagnosis not present

## 2023-06-18 ENCOUNTER — Inpatient Hospital Stay: Payer: Self-pay | Admitting: Nurse Practitioner

## 2023-07-03 ENCOUNTER — Inpatient Hospital Stay: Payer: Self-pay | Admitting: Family Medicine

## 2023-07-14 ENCOUNTER — Encounter: Payer: Self-pay | Admitting: Nurse Practitioner

## 2023-07-14 ENCOUNTER — Ambulatory Visit: Admitting: Nurse Practitioner

## 2023-07-14 VITALS — BP 110/70 | HR 95 | Temp 98.2°F | Wt 127.8 lb

## 2023-07-14 DIAGNOSIS — Z113 Encounter for screening for infections with a predominantly sexual mode of transmission: Secondary | ICD-10-CM

## 2023-07-14 DIAGNOSIS — Z2821 Immunization not carried out because of patient refusal: Secondary | ICD-10-CM

## 2023-07-14 DIAGNOSIS — J302 Other seasonal allergic rhinitis: Secondary | ICD-10-CM | POA: Diagnosis not present

## 2023-07-14 MED ORDER — CETIRIZINE HCL 10 MG PO TABS: 10.0000 mg | ORAL_TABLET | Freq: Every day | ORAL | 2 refills | Status: AC

## 2023-07-14 NOTE — Assessment & Plan Note (Signed)
 Discussed PREP, she is not interested at this time

## 2023-07-14 NOTE — Progress Notes (Signed)
 Madelaine Bhat, CMA,acting as a Neurosurgeon for Arnette Felts, FNP.,have documented all relevant documentation on the behalf of Arnette Felts, FNP,as directed by  Arnette Felts, FNP while in the presence of Arnette Felts, FNP.  Subjective:  Patient ID: Beth Warren , female    DOB: 17-May-2000 , 23 y.o.   MRN: [id number]  No chief complaint on file.   HPI  Patient presents today for routine STD testing denies any symptoms, Patient reports compliance with medication. Patient denies any chest pain, SOB, or headaches. Patient declines HIV and RPR.      Past Medical History:  Diagnosis Date   Bacterial vaginosis    H/O gonorrhea      Family History  Problem Relation Age of Onset   GER disease Mother    Asthma Brother      Current Outpatient Medications:    cetirizine (ZYRTEC) 10 MG chewable tablet, Chew 10 mg by mouth daily., Disp: , Rfl:    cetirizine (ZYRTEC ALLERGY) 10 MG tablet, Take 1 tablet (10 mg total) by mouth daily., Disp: 30 tablet, Rfl: 2   metroNIDAZOLE (METROGEL) 0.75 % vaginal gel, Place 1 Applicatorful vaginally 2 (two) times daily., Disp: 70 g, Rfl: 0   norelgestromin-ethinyl estradiol (XULANE) 150-35 MCG/24HR transdermal patch, Place 1 patch onto the skin once a week. (Patient not taking: Reported on 07/14/2023), Disp: 3 patch, Rfl: 12   No Known Allergies   Review of Systems  Constitutional: Negative.   Respiratory: Negative.    Cardiovascular: Negative.   Neurological: Negative.   Psychiatric/Behavioral: Negative.       Today's Vitals   07/14/23 1145  BP: 110/70  Pulse: 95  Temp: 98.2 F (36.8 C)  TempSrc: Oral  Weight: 127 lb 12.8 oz (58 kg)  PainSc: 0-No pain   Body mass index is 21.94 kg/m.  Wt Readings from Last 3 Encounters:  07/14/23 127 lb 12.8 oz (58 kg)  11/06/22 108 lb 12.8 oz (49.4 kg)  06/24/22 110 lb (49.9 kg)     Objective:  Physical Exam Vitals and nursing note reviewed.  Constitutional:      General: She is not in acute  distress.    Appearance: Normal appearance.  Cardiovascular:     Rate and Rhythm: Normal rate and regular rhythm.     Pulses: Normal pulses.     Heart sounds: Normal heart sounds. No murmur heard. Pulmonary:     Effort: Pulmonary effort is normal. No respiratory distress.     Breath sounds: Normal breath sounds. No wheezing.  Neurological:     Mental Status: She is alert.         Assessment And Plan:  Screening for STDs (sexually transmitted diseases) Assessment & Plan: Discussed PREP, she is not interested at this time  Orders: -     NuSwab Vaginitis Plus (VG+)  Seasonal allergies Assessment & Plan: Rx sent for cetirizine  Orders: -     Cetirizine HCl; Take 1 tablet (10 mg total) by mouth daily.  Dispense: 30 tablet; Refill: 2  COVID-19 vaccination declined Assessment & Plan: Declines covid 19 vaccine. Discussed risk of covid 3 and if she changes her mind about the vaccine to call the office. Education has been provided regarding the importance of this vaccine but patient still declined. Advised may receive this vaccine at local pharmacy or Health Dept.or vaccine clinic. Aware to provide a copy of the vaccination record if obtained from local pharmacy or Health Dept.  Encouraged to take multivitamin, vitamin d,  vitamin c and zinc to increase immune system. Aware can call office if would like to have vaccine here at office. Verbalized acceptance and understanding.      No follow-ups on file.  Patient was given opportunity to ask questions. Patient verbalized understanding of the plan and was able to repeat key elements of the plan. All questions were answered to their satisfaction.    Jeanell Sparrow, FNP, have reviewed all documentation for this visit. The documentation on 07/14/23 for the exam, diagnosis, procedures, and orders are all accurate and complete.   IF YOU HAVE BEEN REFERRED TO A SPECIALIST, IT MAY TAKE 1-2 WEEKS TO SCHEDULE/PROCESS THE REFERRAL. IF YOU HAVE  NOT HEARD FROM US/SPECIALIST IN TWO WEEKS, PLEASE GIVE [place] A CALL AT [phone] X [phone].

## 2023-07-16 DIAGNOSIS — J029 Acute pharyngitis, unspecified: Secondary | ICD-10-CM | POA: Diagnosis not present

## 2023-07-16 DIAGNOSIS — J060 Acute laryngopharyngitis: Secondary | ICD-10-CM | POA: Diagnosis not present

## 2023-07-16 LAB — NUSWAB VAGINITIS PLUS (VG+)
Candida albicans, NAA: NEGATIVE
Candida glabrata, NAA: NEGATIVE
Chlamydia trachomatis, NAA: NEGATIVE
Neisseria gonorrhoeae, NAA: NEGATIVE
Trich vag by NAA: POSITIVE — AB

## 2023-07-17 ENCOUNTER — Encounter: Payer: Self-pay | Admitting: Nurse Practitioner

## 2023-07-20 ENCOUNTER — Encounter: Payer: Self-pay | Admitting: Nurse Practitioner

## 2023-07-20 ENCOUNTER — Other Ambulatory Visit: Payer: Self-pay | Admitting: Nurse Practitioner

## 2023-07-20 DIAGNOSIS — A599 Trichomoniasis, unspecified: Secondary | ICD-10-CM

## 2023-07-20 MED ORDER — METRONIDAZOLE 0.75 % VA GEL: 1.0000 | Freq: Two times a day (BID) | VAGINAL | 0 refills | Status: DC

## 2023-07-21 DIAGNOSIS — H6691 Otitis media, unspecified, right ear: Secondary | ICD-10-CM | POA: Diagnosis not present

## 2023-07-21 DIAGNOSIS — H60501 Unspecified acute noninfective otitis externa, right ear: Secondary | ICD-10-CM | POA: Diagnosis not present

## 2023-07-23 DIAGNOSIS — J302 Other seasonal allergic rhinitis: Secondary | ICD-10-CM | POA: Insufficient documentation

## 2023-07-23 DIAGNOSIS — Z2821 Immunization not carried out because of patient refusal: Secondary | ICD-10-CM | POA: Insufficient documentation

## 2023-07-23 NOTE — Assessment & Plan Note (Signed)

## 2023-07-23 NOTE — Assessment & Plan Note (Signed)
Rx sent for cetirizine

## 2023-09-10 DIAGNOSIS — N390 Urinary tract infection, site not specified: Secondary | ICD-10-CM | POA: Diagnosis not present

## 2024-02-20 ENCOUNTER — Encounter: Payer: Self-pay | Admitting: Emergency Medicine

## 2024-02-20 ENCOUNTER — Ambulatory Visit
Admission: EM | Admit: 2024-02-20 | Discharge: 2024-02-20 | Disposition: A | Discharge status: Home / Self Care | Attending: Family Medicine | Admitting: Family Medicine

## 2024-02-20 DIAGNOSIS — R6884 Jaw pain: Secondary | ICD-10-CM | POA: Diagnosis not present

## 2024-02-20 DIAGNOSIS — R404 Transient alteration of awareness: Secondary | ICD-10-CM

## 2024-02-20 NOTE — Discharge Instructions (Signed)
 Please go to the emergency room for further evaluation.
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
## 2024-04-26 ENCOUNTER — Ambulatory Visit: Payer: Self-pay | Admitting: Internal Medicine

## 2024-05-02 ENCOUNTER — Encounter: Payer: Self-pay | Admitting: Internal Medicine

## 2024-05-02 ENCOUNTER — Ambulatory Visit: Admitting: Internal Medicine

## 2024-05-02 ENCOUNTER — Telehealth: Payer: Self-pay | Admitting: Nurse Practitioner

## 2024-05-02 VITALS — BP 110/80 | HR 91 | Temp 98.3°F | Ht 64.0 in | Wt 130.4 lb

## 2024-05-02 DIAGNOSIS — M7989 Other specified soft tissue disorders: Secondary | ICD-10-CM

## 2024-05-02 NOTE — Patient Instructions (Signed)
 Pockets of Fluid at Joints (Ganglion Cysts): What to Know  Pockets of fluid near your joints are called ganglion cysts. They're not cancer. These cysts often form on joints in your hand or wrist. They can also form: On tendons, which are tissues that connect muscle to bone. On ligaments, which are tissues that connect bones to each other. On joints in your shoulder, elbow, hip, knee, ankle, or foot. The cysts are shaped like balls or eggs. They may be as small as a pea or bigger than a grape. They may get bigger as you move around. What are the causes? The exact cause of these cysts isn't known. But they may be caused by: Irritation or swelling near the joint. An injury or tear in the layers of tissue around the joint. These layers are called the joint capsule. Doing the same movements over and over. Using your joint too much. Having an injury in the past. What increases the risk? You're more likely to get ganglion cysts if: You're female. You're 35-89 years old. What are the signs or symptoms? The main symptom is a lump. It's often on your hand or wrist. In most cases, there aren't other symptoms. In some cases, you may have: Tingling. Pain or tenderness. Numbness. Weakness or loss of strength in the joint. Less range of motion in the part of the body that has the cyst. How is this diagnosed? Ganglion cysts can be diagnosed with an exam. Your health care provider will feel the lump. They may also shine a light next to it. If it's a ganglion cyst, the light will shine through it. You may also have tests. These are done to rule out other conditions. They may include: An X-ray. An ultrasound. An MRI. A CT scan. How is this treated? In many cases, you may not need treatment. Ganglion cysts often go away on their own. But you may need treatment if: You have pain or other symptoms. You can't move as well as you should. The cyst gets infected. To treat the cyst, you may need  to: Wear a brace or splint. Take medicine. Have fluid drained from the cyst with a needle. Get a shot of medicine into the joint. Put a pad in your shoe, or wear shoes that don't rub against the cyst if it's on your foot. Have surgery to remove the cyst. Follow these instructions at home: If you have a brace or splint: Wear it as told. Take it off only if your provider says you can. Check the skin around it every day. Tell your provider if you see problems. Loosen the brace or splint if your fingers or toes tingle, are numb, or turn cold and blue. Keep the brace or splint clean and dry. General instructions Take your medicines only as told. If you have a brace or splint that isn't waterproof: Do not let it get wet. Remove it or cover it when you take a bath or shower. Use a cover that doesn't let any water in. Do not press on the cyst, poke it with a needle, or hit it. Watch the cyst for any changes. Where to find more information American Society for Surgery of the Hand (ASSH): assh.org [employer] ([employer]): foothealthfacts.org Contact a health care provider if: The cyst gets bigger or starts to hurt. There's pus coming from the cyst. You have weakness or numbness near the cyst. You have a fever or chills. Get help right away if: You  have a fever and have any of these near the cyst: More redness. Red streaks. Swelling. This information is not intended to replace advice given to you by your health care provider. Make sure you discuss any questions you have with your health care provider. Document Revised: 09/25/2022 Document Reviewed: 09/25/2022 Elsevier Patient Education  2024 [employer].
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
## 2024-05-23 ENCOUNTER — Encounter: Admitting: Orthopedic Surgery

## 2024-10-31 ENCOUNTER — Encounter: Payer: Self-pay | Admitting: Nurse Practitioner
